# Patient Record
Sex: Male | Born: 1980 | Race: White | Hispanic: No | Marital: Married | Smoking: Never smoker
Health system: Southern US, Community
[De-identification: ages and names within clinical notes are randomized; demographics above are authoritative.]

## PROBLEM LIST (undated history)

## (undated) HISTORY — PX: VASECTOMY: SHX75

---

## 2017-03-28 ENCOUNTER — Encounter: Payer: Self-pay | Admitting: Family Medicine

## 2017-03-28 ENCOUNTER — Ambulatory Visit (INDEPENDENT_AMBULATORY_CARE_PROVIDER_SITE_OTHER): Payer: Managed Care, Other (non HMO) | Admitting: Family Medicine

## 2017-03-28 VITALS — BP 134/82 | HR 67 | Temp 98.0°F | Ht 68.0 in | Wt 204.2 lb

## 2017-03-28 DIAGNOSIS — Z136 Encounter for screening for cardiovascular disorders: Secondary | ICD-10-CM | POA: Diagnosis not present

## 2017-03-28 DIAGNOSIS — E785 Hyperlipidemia, unspecified: Secondary | ICD-10-CM | POA: Insufficient documentation

## 2017-03-28 DIAGNOSIS — Z0000 Encounter for general adult medical examination without abnormal findings: Secondary | ICD-10-CM

## 2017-03-28 DIAGNOSIS — E786 Lipoprotein deficiency: Secondary | ICD-10-CM | POA: Insufficient documentation

## 2017-03-28 NOTE — Progress Notes (Addendum)
Christopher Hoffman is a 36 y.o. male is here to [facility].   History of Present Illness:   Water quality scientist, CMA, acting as scribe for Dr. Juleen China.  CC:  Patient comes in today to establish care.  No concerns or complaints today.  States he had elevated triglycerides several months ago but he was not fasting when that lab was drawn.  He is not fasting today.  No known drug allergies.  He is on no medication.  HPI   Christopher Hoffman is a 36 y.o. male who presents for evaluation of dyslipidemia. The patient does not use medications that may worsen dyslipidemias (corticosteroids, progestins, anabolic steroids, diuretics, beta-blockers, amiodarone, cyclosporine, olanzapine). Exercise: three times a week. Previous history of cardiac disease includes: None. Cardiovascular ROS: no chest pain or dyspnea on exertion.  Health Maintenance Due  Topic Date Due  . HIV Screening  07/09/1997    PMHx, SurgHx, SocialHx, Medications, and Allergies were reviewed in the Visit Navigator and updated as appropriate.  History reviewed. No pertinent past medical history.  Past Surgical History:  Procedure Laterality Date  . VASECTOMY      Family History  Problem Relation Age of Onset  . Cancer Maternal Grandmother   . Hypertension Maternal Grandfather    Social History  Substance Use Topics  . Smoking status: Never Smoker  . Smokeless tobacco: Never Used  . Alcohol use Yes     Comment: occasional   Current Medications and Allergies:  No current outpatient prescriptions on file. No Known Allergies Review of Systems:   Review of Systems  Constitutional: Negative for chills and fever.  HENT: Negative for congestion, ear pain and sore throat.   Eyes: Negative for blurred vision.  Respiratory: Negative for cough and shortness of breath.   Cardiovascular: Negative for chest pain and palpitations.  Gastrointestinal: Negative for abdominal pain, nausea and vomiting.  Genitourinary: Negative for  frequency.  Musculoskeletal: Negative for back pain and neck pain.  Skin: Negative for rash.  Neurological: Negative for dizziness, loss of consciousness and headaches.  Psychiatric/Behavioral: Negative for depression. The patient is not nervous/anxious.     Vitals:   Vitals:   03/28/17 1000  BP: 134/82  Pulse: 67  Temp: 98 F (36.7 C)  TempSrc: Oral  SpO2: 97%  Weight: 204 lb 3.2 oz (92.6 kg)  Height: 5\' 8"  (1.727 m)     Body mass index is 31.05 kg/m.  Physical Exam:   Physical Exam  Constitutional: He is oriented to person, place, and time. He appears well-developed and well-nourished. No distress.  HENT:  Head: Normocephalic and atraumatic.  Right Ear: External ear normal.  Left Ear: External ear normal.  Nose: Nose normal.  Mouth/Throat: Oropharynx is clear and moist.  Eyes: Conjunctivae and EOM are normal. Pupils are equal, round, and reactive to light.  Neck: Normal range of motion. Neck supple.  Cardiovascular: Normal rate, regular rhythm, normal heart sounds and intact distal pulses.   Pulmonary/Chest: Effort normal and breath sounds normal.  Abdominal: Soft. Bowel sounds are normal.  Musculoskeletal: Normal range of motion.  Neurological: He is alert and oriented to person, place, and time.  Skin: Skin is warm and dry.  Psychiatric: He has a normal mood and affect. His behavior is normal. Judgment and thought content normal.  Nursing note and vitals reviewed.    Right Anterior Shoulder     Assessment and Plan:    Christopher Hoffman was seen today for establish care.  Diagnoses and all orders  for this visit:  Routine health maintenance -     Comprehensive metabolic panel; Future  Encounter for screening for cardiovascular disorders -     Lipid panel; Future   . Reviewed expectations re: course of current medical issues. . Discussed self-management of symptoms. . Outlined signs and symptoms indicating need for more acute intervention. . Patient verbalized  understanding and all questions were answered. . See orders for this visit as documented in the electronic medical record. . Patient received an After Visit Summary.  CMA served as Education administrator during this visit. History, Physical, and Plan performed by medical provider. Documentation and orders reviewed and attested to. Briscoe Deutscher, D.O.  Briscoe Deutscher, [place], [place] 03/28/2017   Follow-up: No Follow-up on file.  No orders of the defined types were placed in this encounter.  There are no discontinued medications. Orders Placed This Encounter  Procedures  . Comprehensive metabolic panel  . Lipid panel

## 2017-04-04 ENCOUNTER — Other Ambulatory Visit (INDEPENDENT_AMBULATORY_CARE_PROVIDER_SITE_OTHER): Payer: Managed Care, Other (non HMO)

## 2017-04-04 DIAGNOSIS — Z0000 Encounter for general adult medical examination without abnormal findings: Secondary | ICD-10-CM | POA: Diagnosis not present

## 2017-04-04 DIAGNOSIS — Z136 Encounter for screening for cardiovascular disorders: Secondary | ICD-10-CM

## 2017-04-04 LAB — COMPREHENSIVE METABOLIC PANEL
ALT: 27 U/L (ref 0–53)
AST: 30 U/L (ref 0–37)
Albumin: 4.6 g/dL (ref 3.5–5.2)
Alkaline Phosphatase: 66 U/L (ref 39–117)
BUN: 18 mg/dL (ref 6–23)
CO2: 29 mEq/L (ref 19–32)
Calcium: 9.3 mg/dL (ref 8.4–10.5)
Chloride: 105 mEq/L (ref 96–112)
Creatinine, Ser: 0.97 mg/dL (ref 0.40–1.50)
GFR: 93.76 mL/min (ref >60.00)
Glucose, Bld: 103 mg/dL — ABNORMAL HIGH (ref 70–99)
Potassium: 4.2 mEq/L (ref 3.5–5.1)
Sodium: 139 mEq/L (ref 135–145)
Total Bilirubin: 0.6 mg/dL (ref 0.2–1.2)
Total Protein: 7.1 g/dL (ref 6.0–8.3)

## 2017-04-04 LAB — LIPID PANEL
Cholesterol: 156 mg/dL (ref 0–200)
HDL: 37.9 mg/dL — ABNORMAL LOW (ref >39.00)
LDL Cholesterol: 98 mg/dL (ref 0–99)
NonHDL: 118.51
Total CHOL/HDL Ratio: 4
Triglycerides: 104 mg/dL (ref 0.0–149.0)
VLDL: 20.8 mg/dL (ref 0.0–40.0)

## 2017-04-09 ENCOUNTER — Telehealth: Payer: Self-pay | Admitting: Family Medicine

## 2017-04-30 ENCOUNTER — Telehealth: Payer: Self-pay | Admitting: Family Medicine

## 2017-04-30 NOTE — Telephone Encounter (Signed)
Received  9 pages from [facility] forwarded to Dr. Briscoe Deutscher.

## 2017-07-19 ENCOUNTER — Encounter (HOSPITAL_COMMUNITY): Payer: Self-pay | Admitting: *Deleted

## 2017-07-19 ENCOUNTER — Ambulatory Visit (HOSPITAL_COMMUNITY)
Admission: EM | Admit: 2017-07-19 | Discharge: 2017-07-19 | Disposition: A | Discharge status: Home / Self Care | Payer: Managed Care, Other (non HMO) | Attending: Emergency Medicine | Admitting: Emergency Medicine

## 2017-07-19 ENCOUNTER — Emergency Department (HOSPITAL_COMMUNITY): Payer: Managed Care, Other (non HMO)

## 2017-07-19 ENCOUNTER — Emergency Department (HOSPITAL_COMMUNITY): Payer: Managed Care, Other (non HMO) | Admitting: Anesthesiology

## 2017-07-19 ENCOUNTER — Ambulatory Visit: Payer: Self-pay | Admitting: Surgery

## 2017-07-19 ENCOUNTER — Encounter (HOSPITAL_COMMUNITY)
Admission: EM | Disposition: A | Discharge status: Home / Self Care | Payer: Self-pay | Source: Home / Self Care | Attending: Emergency Medicine

## 2017-07-19 DIAGNOSIS — K439 Ventral hernia without obstruction or gangrene: Secondary | ICD-10-CM | POA: Diagnosis not present

## 2017-07-19 DIAGNOSIS — K353 Acute appendicitis with localized peritonitis, without perforation or gangrene: Secondary | ICD-10-CM

## 2017-07-19 DIAGNOSIS — D1803 Hemangioma of intra-abdominal structures: Secondary | ICD-10-CM | POA: Insufficient documentation

## 2017-07-19 DIAGNOSIS — R161 Splenomegaly, not elsewhere classified: Secondary | ICD-10-CM | POA: Insufficient documentation

## 2017-07-19 DIAGNOSIS — R109 Unspecified abdominal pain: Secondary | ICD-10-CM | POA: Diagnosis present

## 2017-07-19 HISTORY — PX: LAPAROSCOPIC APPENDECTOMY: SHX408

## 2017-07-19 LAB — CBC
HEMATOCRIT: 46.2 % (ref 39.0–52.0)
Hemoglobin: 16.4 g/dL (ref 13.0–17.0)
MCH: 30 pg (ref 26.0–34.0)
MCHC: 35.5 g/dL (ref 30.0–36.0)
MCV: 84.5 fL (ref 78.0–100.0)
Platelets: 213 10*3/uL (ref 150–400)
RBC: 5.47 MIL/uL (ref 4.22–5.81)
RDW: 12.5 % (ref 11.5–15.5)
WBC: 15.5 10*3/uL — AB (ref 4.0–10.5)

## 2017-07-19 LAB — URINALYSIS, ROUTINE W REFLEX MICROSCOPIC
Bilirubin Urine: NEGATIVE
GLUCOSE, UA: NEGATIVE mg/dL
Hgb urine dipstick: NEGATIVE
KETONES UR: NEGATIVE mg/dL
LEUKOCYTES UA: NEGATIVE
Nitrite: NEGATIVE
PH: 7 (ref 5.0–8.0)
Protein, ur: NEGATIVE mg/dL
Specific Gravity, Urine: 1.018 (ref 1.005–1.030)

## 2017-07-19 LAB — COMPREHENSIVE METABOLIC PANEL
ALBUMIN: 4.7 g/dL (ref 3.5–5.0)
ALT: 28 U/L (ref 17–63)
AST: 22 U/L (ref 15–41)
Alkaline Phosphatase: 69 U/L (ref 38–126)
Anion gap: 10 (ref 5–15)
BUN: 15 mg/dL (ref 6–20)
CHLORIDE: 101 mmol/L (ref 101–111)
CO2: 26 mmol/L (ref 22–32)
Calcium: 9.5 mg/dL (ref 8.9–10.3)
Creatinine, Ser: 0.98 mg/dL (ref 0.61–1.24)
GFR calc Af Amer: >60 mL/min (ref >60)
Glucose, Bld: 109 mg/dL — ABNORMAL HIGH (ref 65–99)
POTASSIUM: 4 mmol/L (ref 3.5–5.1)
SODIUM: 137 mmol/L (ref 135–145)
Total Bilirubin: 1 mg/dL (ref 0.3–1.2)
Total Protein: 7.5 g/dL (ref 6.5–8.1)

## 2017-07-19 LAB — LIPASE, BLOOD: LIPASE: 31 U/L (ref 11–51)

## 2017-07-19 SURGERY — APPENDECTOMY, LAPAROSCOPIC
Anesthesia: General | Site: Abdomen

## 2017-07-19 MED ORDER — METRONIDAZOLE IN NACL 5-0.79 MG/ML-% IV SOLN
500.0000 mg | INTRAVENOUS | Status: DC
  Filled 2017-07-19 (×2): qty 100

## 2017-07-19 MED ORDER — FENTANYL CITRATE (PF) 250 MCG/5ML IJ SOLN
INTRAMUSCULAR | Status: AC
  Filled 2017-07-19: qty 5

## 2017-07-19 MED ORDER — DEXTROSE 5 % IV SOLN
2.0000 g | Freq: Once | INTRAVENOUS | Status: DC
  Administered 2017-07-19: 2 g via INTRAVENOUS

## 2017-07-19 MED ORDER — ONDANSETRON HCL 4 MG/2ML IJ SOLN
INTRAMUSCULAR | Status: DC | PRN
  Administered 2017-07-19: 4 mg via INTRAVENOUS

## 2017-07-19 MED ORDER — DOCUSATE SODIUM 100 MG PO CAPS: 100.0000 mg | ORAL_CAPSULE | Freq: Two times a day (BID) | ORAL | 0 refills | Status: AC

## 2017-07-19 MED ORDER — ONDANSETRON HCL 4 MG/2ML IJ SOLN
INTRAMUSCULAR | Status: AC
  Filled 2017-07-19: qty 2

## 2017-07-19 MED ORDER — HYDROCODONE-ACETAMINOPHEN 5-325 MG PO TABS: 1.0000 | ORAL_TABLET | Freq: Four times a day (QID) | ORAL | 0 refills | Status: DC | PRN

## 2017-07-19 MED ORDER — FENTANYL CITRATE (PF) 100 MCG/2ML IJ SOLN
INTRAMUSCULAR | Status: DC | PRN
  Administered 2017-07-19 (×5): 50 ug via INTRAVENOUS

## 2017-07-19 MED ORDER — SODIUM CHLORIDE 0.9 % IV SOLN
INTRAVENOUS | Status: DC | PRN
  Administered 2017-07-19: 16:00:00 via INTRAVENOUS

## 2017-07-19 MED ORDER — METRONIDAZOLE IN NACL 5-0.79 MG/ML-% IV SOLN
500.0000 mg | Freq: Once | INTRAVENOUS | Status: DC
  Administered 2017-07-19: 500 mg via INTRAVENOUS

## 2017-07-19 MED ORDER — ROCURONIUM BROMIDE 10 MG/ML (PF) SYRINGE
PREFILLED_SYRINGE | INTRAVENOUS | Status: AC
  Filled 2017-07-19: qty 5

## 2017-07-19 MED ORDER — 0.9 % SODIUM CHLORIDE (POUR BTL) OPTIME
TOPICAL | Status: DC | PRN
  Administered 2017-07-19: 1000 mL

## 2017-07-19 MED ORDER — MEPERIDINE HCL 25 MG/ML IJ SOLN: 6.2500 mg | INTRAMUSCULAR | Status: DC | PRN

## 2017-07-19 MED ORDER — MIDAZOLAM HCL 5 MG/5ML IJ SOLN
INTRAMUSCULAR | Status: DC | PRN
  Administered 2017-07-19: 2 mg via INTRAVENOUS

## 2017-07-19 MED ORDER — DEXTROSE 5 % IV SOLN
2.0000 g | INTRAVENOUS | Status: DC
  Filled 2017-07-19 (×2): qty 2

## 2017-07-19 MED ORDER — BUPIVACAINE-EPINEPHRINE 0.25% -1:200000 IJ SOLN
INTRAMUSCULAR | Status: DC | PRN
  Administered 2017-07-19: 10 mL

## 2017-07-19 MED ORDER — MIDAZOLAM HCL 2 MG/2ML IJ SOLN
INTRAMUSCULAR | Status: AC
  Filled 2017-07-19: qty 2

## 2017-07-19 MED ORDER — PROPOFOL 10 MG/ML IV BOLUS
INTRAVENOUS | Status: AC
  Filled 2017-07-19: qty 20

## 2017-07-19 MED ORDER — LIDOCAINE HCL (CARDIAC) 20 MG/ML IV SOLN
INTRAVENOUS | Status: DC | PRN
  Administered 2017-07-19: 50 mg via INTRAVENOUS

## 2017-07-19 MED ORDER — LIDOCAINE 2% (20 MG/ML) 5 ML SYRINGE
INTRAMUSCULAR | Status: AC
  Filled 2017-07-19: qty 5

## 2017-07-19 MED ORDER — SUGAMMADEX SODIUM 200 MG/2ML IV SOLN
INTRAVENOUS | Status: AC
  Filled 2017-07-19: qty 2

## 2017-07-19 MED ORDER — HYDROMORPHONE HCL 1 MG/ML IJ SOLN
INTRAMUSCULAR | Status: AC
  Administered 2017-07-19: 0.5 mg via INTRAVENOUS
  Filled 2017-07-19: qty 1

## 2017-07-19 MED ORDER — BUPIVACAINE-EPINEPHRINE (PF) 0.25% -1:200000 IJ SOLN
INTRAMUSCULAR | Status: AC
  Filled 2017-07-19: qty 30

## 2017-07-19 MED ORDER — LACTATED RINGERS IV SOLN
INTRAVENOUS | Status: DC | PRN
  Administered 2017-07-19: 15:00:00 via INTRAVENOUS

## 2017-07-19 MED ORDER — SODIUM CHLORIDE 0.9 % IR SOLN
Status: DC | PRN
  Administered 2017-07-19: 1000 mL

## 2017-07-19 MED ORDER — SUCCINYLCHOLINE CHLORIDE 200 MG/10ML IV SOSY
PREFILLED_SYRINGE | INTRAVENOUS | Status: AC
  Filled 2017-07-19: qty 10

## 2017-07-19 MED ORDER — PROMETHAZINE HCL 25 MG/ML IJ SOLN: 6.2500 mg | INTRAMUSCULAR | Status: DC | PRN

## 2017-07-19 MED ORDER — ROCURONIUM BROMIDE 100 MG/10ML IV SOLN
INTRAVENOUS | Status: DC | PRN
  Administered 2017-07-19: 10 mg via INTRAVENOUS
  Administered 2017-07-19: 40 mg via INTRAVENOUS

## 2017-07-19 MED ORDER — HYDROMORPHONE HCL 1 MG/ML IJ SOLN
0.2500 mg | INTRAMUSCULAR | Status: DC | PRN
  Administered 2017-07-19 (×2): 0.5 mg via INTRAVENOUS

## 2017-07-19 MED ORDER — LACTATED RINGERS IV SOLN: INTRAVENOUS | Status: DC

## 2017-07-19 MED ORDER — IOPAMIDOL (ISOVUE-300) INJECTION 61%
INTRAVENOUS | Status: AC
  Filled 2017-07-19: qty 100

## 2017-07-19 MED ORDER — SODIUM CHLORIDE 0.9 % IV BOLUS (SEPSIS)
1000.0000 mL | Freq: Once | INTRAVENOUS | Status: AC
  Administered 2017-07-19: 1000 mL via INTRAVENOUS

## 2017-07-19 MED ORDER — DEXAMETHASONE SODIUM PHOSPHATE 10 MG/ML IJ SOLN
INTRAMUSCULAR | Status: DC | PRN
  Administered 2017-07-19: 5 mg via INTRAVENOUS

## 2017-07-19 MED ORDER — ARTIFICIAL TEARS OPHTHALMIC OINT: TOPICAL_OINTMENT | OPHTHALMIC | Status: DC | PRN

## 2017-07-19 MED ORDER — SUGAMMADEX SODIUM 200 MG/2ML IV SOLN
INTRAVENOUS | Status: DC | PRN
  Administered 2017-07-19: 200 mg via INTRAVENOUS

## 2017-07-19 MED ORDER — PROPOFOL 10 MG/ML IV BOLUS
INTRAVENOUS | Status: DC | PRN
  Administered 2017-07-19: 180 mg via INTRAVENOUS

## 2017-07-19 MED ORDER — IOPAMIDOL (ISOVUE-300) INJECTION 61%
INTRAVENOUS | Status: AC
  Administered 2017-07-19: 100 mL
  Filled 2017-07-19: qty 100

## 2017-07-19 MED ORDER — SUCCINYLCHOLINE CHLORIDE 20 MG/ML IJ SOLN
INTRAMUSCULAR | Status: DC | PRN
  Administered 2017-07-19: 140 mg via INTRAVENOUS

## 2017-07-19 MED ORDER — DEXAMETHASONE SODIUM PHOSPHATE 10 MG/ML IJ SOLN
INTRAMUSCULAR | Status: AC
  Filled 2017-07-19: qty 1

## 2017-07-19 SURGICAL SUPPLY — 39 items
BLADE CLIPPER SURG (BLADE) IMPLANT
CANISTER SUCT 3000ML PPV (MISCELLANEOUS) ×2 IMPLANT
CHLORAPREP W/TINT 26ML (MISCELLANEOUS) ×2 IMPLANT
COVER SURGICAL LIGHT HANDLE (MISCELLANEOUS) ×2 IMPLANT
CUTTER FLEX LINEAR 45M (STAPLE) ×2 IMPLANT
DERMABOND ADHESIVE PROPEN (GAUZE/BANDAGES/DRESSINGS) ×1
DERMABOND ADVANCED (GAUZE/BANDAGES/DRESSINGS) ×1
DERMABOND ADVANCED .7 DNX12 (GAUZE/BANDAGES/DRESSINGS) ×1 IMPLANT
DERMABOND ADVANCED .7 DNX6 (GAUZE/BANDAGES/DRESSINGS) ×1 IMPLANT
DEVICE PMI PUNCTURE CLOSURE (MISCELLANEOUS) ×2 IMPLANT
ELECT REM PT RETURN 9FT ADLT (ELECTROSURGICAL) ×2
ELECTRODE REM PT RTRN 9FT ADLT (ELECTROSURGICAL) ×1 IMPLANT
GLOVE BIO SURGEON STRL SZ 6 (GLOVE) ×2 IMPLANT
GLOVE BIO SURGEON STRL SZ7 (GLOVE) ×2 IMPLANT
GLOVE BIOGEL PI IND STRL 6.5 (GLOVE) ×2 IMPLANT
GLOVE BIOGEL PI IND STRL 7.0 (GLOVE) ×1 IMPLANT
GLOVE BIOGEL PI INDICATOR 6.5 (GLOVE) ×2
GLOVE BIOGEL PI INDICATOR 7.0 (GLOVE) ×1
GOWN STRL REUS W/ TWL LRG LVL3 (GOWN DISPOSABLE) ×3 IMPLANT
GOWN STRL REUS W/TWL LRG LVL3 (GOWN DISPOSABLE) ×3
KIT BASIN OR (CUSTOM PROCEDURE TRAY) ×2 IMPLANT
KIT ROOM TURNOVER OR (KITS) ×2 IMPLANT
NDL INSUFFICATION SHT 14GA (NEEDLE) ×2 IMPLANT
NEEDLE INSUFFLATION 14GA 120MM (NEEDLE) ×2 IMPLANT
NS IRRIG 1000ML POUR BTL (IV SOLUTION) ×2 IMPLANT
PAD ARMBOARD 7.5X6 YLW CONV (MISCELLANEOUS) ×4 IMPLANT
POUCH SPECIMEN RETRIEVAL 10MM (ENDOMECHANICALS) ×2 IMPLANT
RELOAD 45 VASCULAR/THIN (ENDOMECHANICALS) ×4 IMPLANT
RELOAD STAPLE TA45 3.5 REG BLU (ENDOMECHANICALS) ×2 IMPLANT
SET IRRIG TUBING LAPAROSCOPIC (IRRIGATION / IRRIGATOR) ×2 IMPLANT
SLEEVE ENDOPATH XCEL 5M (ENDOMECHANICALS) ×2 IMPLANT
SPECIMEN JAR SMALL (MISCELLANEOUS) ×2 IMPLANT
SUT MNCRL AB 4-0 PS2 18 (SUTURE) ×2 IMPLANT
TOWEL OR 17X24 6PK STRL BLUE (TOWEL DISPOSABLE) ×2 IMPLANT
TRAY FOLEY CATH SILVER 16FR (SET/KITS/TRAYS/PACK) ×2 IMPLANT
TRAY LAPAROSCOPIC MC (CUSTOM PROCEDURE TRAY) ×2 IMPLANT
TROCAR XCEL 12X100 BLDLESS (ENDOMECHANICALS) ×2 IMPLANT
TROCAR XCEL NON-BLD 5MMX100MML (ENDOMECHANICALS) ×2 IMPLANT
TUBING INSUFFLATION (TUBING) ×2 IMPLANT

## 2017-07-19 NOTE — ED Notes (Signed)
Hand off given to [facility].

## 2017-07-19 NOTE — Anesthesia Postprocedure Evaluation (Signed)
Anesthesia Post Note  Patient: Christopher Hoffman  Procedure(s) Performed: Procedure(s) (LRB): APPENDECTOMY LAPAROSCOPIC (N/A)     Patient location during evaluation: PACU Anesthesia Type: General Level of consciousness: awake and alert Pain management: pain level controlled Vital Signs Assessment: post-procedure vital signs reviewed and stable Respiratory status: spontaneous breathing, nonlabored ventilation, respiratory function stable and patient connected to nasal cannula oxygen Cardiovascular status: blood pressure returned to baseline and stable Postop Assessment: no signs of nausea or vomiting Anesthetic complications: no    Last Vitals:  Vitals:   07/19/17 1755 07/19/17 1800  BP: 138/83   Pulse: 83 82  Resp: 10 14  Temp:  37.1 C  SpO2: 94% 94%    Last Pain:  Vitals:   07/19/17 1722  TempSrc:   PainSc: 6     LLE Motor Response: Purposeful movement;Responds to commands (07/19/17 1800) LLE Sensation: Full sensation (07/19/17 1800) RLE Motor Response: Purposeful movement;Responds to commands (07/19/17 1800) RLE Sensation: Full sensation (07/19/17 1800)      Effie Berkshire

## 2017-07-19 NOTE — ED Triage Notes (Signed)
Pt reports onset this early am of RLQ. Denies n/v/d or fever. No acute distress is noted at triage.

## 2017-07-19 NOTE — Op Note (Signed)
Operative Report  Christopher Hoffman 36 y.o. male  [id number]  [id number]  07/19/2017  Surgeon: Clovis Riley   Assistant: none  Procedure performed: Laparoscopic Appendectomy  Preop diagnosis: Acute appendicitis  Post-op diagnosis/intraop findings: Acute appendicitis - with localized peritonitis   Specimens: appendix  EBL: minimal  Complications: none  Description of procedure: After obtaining informed consent the patient was brought to the operating room. Antibiotics and subcutaneous heparin were administered. SCD's were applied. General endotracheal anesthesia was initiated and a formal time-out was performed. The abdomen was prepped and draped in the usual sterile fashion and the abdomen was entered using an infraumbilical Veress needle and insufflated to 15 mmHg. A 5 mm trocar and camera were then introduced, the abdomen was inspected and there is no evidence of injury from our entry. A suprapubic 5 mm trocar and a left lower quadrant 12 mm trocar were introduced under direct visualization following infiltration with local. The patient was then placed in Trendelenburg and rotated to the left and the small bowel was reflected cephalad. The appendix was visualized: it was acutely inflamed with a purulent exudate and scant adjacent murky fluid but no perforation. The appendix was grasped and gentle blunt sweeping released some inflammatory adhesions to the abdominal wall and cecum/mesentery. A window was created at the base of the appendix and a blue load linear cutting stapler was used to transect the appendix from the cecum. The tissue here was healthy and uninflamed appearing. A white load linear cutting stapler was then used to transect the appendiceal mesentery. Hemostasis was ensured. The appendix was placed in an Endo Catch bag and removed through our 12 mm trocar site. The right lower quadrant and pelvis were aspirated and irrigated with warm saline, the effluent was  clear. The omentum was directed to lie over the staple lines. The 89mm trocar site in the left lower quadrant was closed with a 0 vicryl in the fascia under direct visualization using a PMI device. The abdomen was desufflated and all trocars removed. The skin incisions were closed with running subcuticular monocryl and Dermabond. The patient was awakened, extubated and transported to the recovery room in stable condition.   All counts were correct at the completion of the case.

## 2017-07-19 NOTE — Transfer of Care (Signed)
Immediate Anesthesia Transfer of Care Note  Patient: Christopher Hoffman [facility]  Procedure(s) Performed: Procedure(s): APPENDECTOMY LAPAROSCOPIC (N/A)  Patient Location: PACU  Anesthesia Type:General  Level of Consciousness: awake and alert   Airway & Oxygen Therapy: Patient Spontanous Breathing  Post-op Assessment: Report given to RN and Post -op Vital signs reviewed and stable  Post vital signs: Reviewed and stable  Last Vitals:  Vitals:   07/19/17 1447 07/19/17 1449  BP:  (!) 148/96  Pulse: 83   Resp:    Temp:    SpO2: 100%     Last Pain:  Vitals:   07/19/17 1012  TempSrc: Oral  PainSc:          Complications: No apparent anesthesia complications

## 2017-07-19 NOTE — Anesthesia Procedure Notes (Signed)
Procedure Name: Intubation Date/Time: 07/19/2017 3:58 PM Performed by: Suzy Bouchard Pre-anesthesia Checklist: Patient identified, Suction available, Emergency Drugs available, Patient being monitored and Timeout performed Patient Re-evaluated:Patient Re-evaluated prior to induction Oxygen Delivery Method: Circle system utilized Preoxygenation: Pre-oxygenation with 100% oxygen Induction Type: IV induction and Rapid sequence Laryngoscope Size: Miller and 2 Grade View: Grade II Tube type: Oral Tube size: 7.5 mm Number of attempts: 1 Airway Equipment and Method: Stylet Placement Confirmation: ETT inserted through vocal cords under direct vision,  positive ETCO2 and breath sounds checked- equal and bilateral Secured at: 21 cm Tube secured with: Tape Dental Injury: Teeth and Oropharynx as per pre-operative assessment

## 2017-07-19 NOTE — Discharge Instructions (Signed)
[facility] ______[facility] °LAPAROSCOPIC SURGERY: POST OP INSTRUCTIONS °Always review your discharge instruction sheet given to you by the facility where your surgery was performed. °IF YOU HAVE DISABILITY OR FAMILY LEAVE FORMS, YOU MUST BRING THEM TO THE OFFICE FOR PROCESSING.   °DO NOT GIVE THEM TO YOUR DOCTOR. ° °1. A prescription for pain medication may be given to you upon discharge.  Take your pain medication as prescribed, if needed.  If narcotic pain medicine is not needed, then you may take acetaminophen (Tylenol) or ibuprofen (Advil) as needed. °2. Take your usually prescribed medications unless otherwise directed. °3. If you need a refill on your pain medication, please contact your pharmacy.  They will contact our office to request authorization. Prescriptions will not be filled after 5pm or on week-ends. °4. You should follow a light diet the first few days after arrival home, such as soup and crackers, etc.  Be sure to include lots of fluids daily. °5. Most patients will experience some swelling and bruising in the area of the incisions.  Ice packs will help.  Swelling and bruising can take several days to resolve.  °6. It is common to experience some constipation if taking pain medication after surgery.  Increasing fluid intake and taking a stool softener (such as Colace) will usually help or prevent this problem from occurring.  A mild laxative (Milk of Magnesia or Miralax) should be taken according to package instructions if there are no bowel movements after 48 hours. °7. Unless discharge instructions indicate otherwise, you may remove your bandages 24-48 hours after surgery, and you may shower at that time.  You may have steri-strips (small skin tapes) in place directly over the incision.  These strips should be left on the skin for 7-10 days.  If your surgeon used skin glue on the incision, you may shower in 24 hours.  The glue will flake off over the next 2-3 weeks.  Any sutures or  staples will be removed at the office during your follow-up visit. °8. ACTIVITIES:  You may resume regular (light) daily activities beginning the next day--such as daily self-care, walking, climbing stairs--gradually increasing activities as tolerated.  You may have sexual intercourse when it is comfortable.  Refrain from any heavy lifting or straining until approved by your doctor. °a. You may drive when you are no longer taking prescription pain medication, you can comfortably wear a seatbelt, and you can safely maneuver your car and apply brakes. °b. RETURN TO WORK:  __1 week________________________________________________________ °9. You should see your doctor in the office for a follow-up appointment approximately 2-3 weeks after your surgery.  Make sure that you call for this appointment within a day or two after you arrive home to insure a convenient appointment time. °10. OTHER INSTRUCTIONS: __________________________________________________________________________________________________________________________ __________________________________________________________________________________________________________________________ °WHEN TO CALL YOUR DOCTOR: °1. Fever over 101.0 °2. Inability to urinate °3. Continued bleeding from incision. °4. Increased pain, redness, or drainage from the incision. °5. Increasing abdominal pain ° °The clinic staff is available to answer your questions during regular business hours.  Please don’t hesitate to call and ask to speak to one of the nurses for clinical concerns.  If you have a medical emergency, go to the nearest emergency room or call 911.  A surgeon from [facility] is always on call at the hospital. °[address], [address], [place], [place]  [address] ? [address] [address], [place], [place]   [address] °[phone] ? [phone] ? FAX [phone] °Web   site: [email or web address] ° °

## 2017-07-19 NOTE — ED Provider Notes (Signed)
[facility] DEPT Provider Note   CSN: [id number] Arrival date & time: 07/19/17  4854     History   Chief Complaint Chief Complaint  Patient presents with  . Abdominal Pain    HPI Christopher Hoffman is a 36 y.o. male.  HPI  36 year old male presents with acute right lower quadrant abdominal pain that started around 3 AM. He states that the pain is a dull ache at all times but any type of minimal movement or adjusting causes more severe sharp pain. There is no fever, back pain, nausea, vomiting, or urinary symptoms. He states he had a couple bowel movements but this did not help. He became concerned for possible appendicitis and he came into the ER. Has not taken anything for the pain. Denies any testicular pain. No hematuria.  History reviewed. No pertinent past medical history.  Patient Active Problem List   Diagnosis Date Noted  . Hyperlipidemia 03/28/2017    Past Surgical History:  Procedure Laterality Date  . VASECTOMY         Home Medications    Prior to Admission medications   Medication Sig Start Date End Date Taking? Authorizing Provider  docusate sodium (COLACE) 100 MG capsule Take 1 capsule (100 mg total) by mouth 2 (two) times daily. 07/19/17 08/18/17  Clovis Riley, MD  HYDROcodone-acetaminophen (NORCO/VICODIN) 5-325 MG tablet Take 1 tablet by mouth every 6 (six) hours as needed for moderate pain. 07/19/17   Clovis Riley, MD    Family History Family History  Problem Relation Age of Onset  . Hyperlipidemia Father   . Cancer Maternal Grandmother   . Hypertension Maternal Grandfather     Social History Social History  Substance Use Topics  . Smoking status: Never Smoker  . Smokeless tobacco: Never Used  . Alcohol use Yes     Comment: occasional     Allergies   Patient has no known allergies.   Review of Systems Review of Systems  Gastrointestinal: Positive for abdominal pain. Negative for nausea and vomiting.  Genitourinary:  Negative for dysuria, hematuria and testicular pain.  Musculoskeletal: Negative for back pain.  All other systems reviewed and are negative.    Physical Exam Updated Vital Signs BP 138/83   Pulse 82   Temp 98.8 F (37.1 C)   Resp 14   Ht 5\' 8"  (1.727 m)   Wt 92.1 kg (203 lb)   SpO2 94%   BMI 30.87 kg/m   Physical Exam  Constitutional: He is oriented to person, place, and time. He appears well-developed and well-nourished. No distress.  HENT:  Head: Normocephalic and atraumatic.  Right Ear: External ear normal.  Left Ear: External ear normal.  Nose: Nose normal.  Eyes: Right eye exhibits no discharge. Left eye exhibits no discharge.  Neck: Neck supple.  Cardiovascular: Normal rate, regular rhythm and normal heart sounds.   Pulmonary/Chest: Effort normal and breath sounds normal.  Abdominal: Soft. There is tenderness in the right lower quadrant.  Genitourinary: Testes normal and penis normal. Right testis shows no swelling and no tenderness. Left testis shows no swelling and no tenderness. Circumcised.  Musculoskeletal: He exhibits no edema.  Neurological: He is alert and oriented to person, place, and time.  Skin: Skin is warm and dry. He is not diaphoretic.  Nursing note and vitals reviewed.    ED Treatments / Results  Labs (all labs ordered are listed, but only abnormal results are displayed) Labs Reviewed  COMPREHENSIVE METABOLIC PANEL - Abnormal; Notable for  the following:       Result Value   Glucose, Bld 109 (*)    All other components within normal limits  CBC - Abnormal; Notable for the following:    WBC 15.5 (*)    All other components within normal limits  LIPASE, BLOOD  URINALYSIS, ROUTINE W REFLEX MICROSCOPIC  SURGICAL PATHOLOGY    EKG  EKG Interpretation None       Radiology Ct Abdomen Pelvis W Contrast  Result Date: 07/19/2017 CLINICAL DATA:  Right lower quadrant pain EXAM: CT ABDOMEN AND PELVIS WITH CONTRAST TECHNIQUE: Multidetector CT  imaging of the abdomen and pelvis was performed using the standard protocol following bolus administration of intravenous contrast. CONTRAST:  140mL ISOVUE-300 IOPAMIDOL (ISOVUE-300) INJECTION 61% COMPARISON:  None. FINDINGS: Lower chest: Lung bases are clear. Hepatobiliary: There is an apparent hemangioma in the posterior segment of the right lobe of the liver measuring 2.7 x 1.2 cm. No other focal liver lesions are evident. Gallbladder wall is not appreciably thickened. There is no biliary duct dilatation. Pancreas: There is no pancreatic mass or inflammatory focus. Spleen: Spleen measures 14.1 x 12.9 x 6.2 cm with a measured splenic volume of 564 cubic cm. There are small cysts along the periphery of the spleen laterally. Spleen otherwise appears unremarkable. Adrenals/Urinary Tract: Adrenals appear normal bilaterally. Kidneys bilaterally show no evident mass or hydronephrosis on either side. There is no renal or ureteral calculus on either side. Urinary bladder is midline with wall thickness within normal limits. Stomach/Bowel: There is no appreciable bowel wall or mesenteric thickening. No evident bowel obstruction. No free air or portal venous air. Vascular/Lymphatic: No abdominal aortic aneurysm. No vascular lesion evident. There is no appreciable adenopathy in the abdomen or pelvis. Reproductive: Prostate and seminal vesicles appear normal in size and contour. No pelvic mass evident. Other: The appendix is enlarged, measuring 11 mm in diameter. There is periappendiceal soft tissue thickening and stranding. These are findings indicative of acute appendiceal inflammation. No abscess or perforation is evident in the appendiceal region. No abscess or ascites noted elsewhere in the abdomen or pelvis. There is a rather minimal ventral hernia containing only fat. Musculoskeletal: There are no blastic or lytic bone lesions. There is no intramuscular or abdominal wall lesion. IMPRESSION: 1. Findings indicative of  acute appendiceal inflammation. No abscess or perforation evident in the appendiceal region. 2.  Splenomegaly.  Small cysts in the periphery of the spleen. 3. 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 4.  Small ventral hernia containing only fat. 5.  No abscess.  No bowel obstruction. 6.  No renal or ureteral calculus.  No hydronephrosis. Critical Value/emergent results were called by telephone at the time of interpretation on 07/19/2017 at 1:54 pm to Dr. Sherwood Gambler , who verbally acknowledged these results. Electronically Signed   By: Lowella Grip III M.D.   On: 07/19/2017 13:58    Procedures Procedures (including critical care time)  Medications Ordered in ED Medications  cefTRIAXone (ROCEPHIN) 2 g in dextrose 5 % 50 mL IVPB ( Intravenous Canceled Entry 07/19/17 1552)    And  metroNIDAZOLE (FLAGYL) IVPB 500 mg (not administered)  lactated ringers infusion (not administered)  HYDROmorphone (DILAUDID) injection 0.25-0.5 mg (0.5 mg Intravenous Given 07/19/17 1723)  meperidine (DEMEROL) injection 6.25-12.5 mg (not administered)  promethazine (PHENERGAN) injection 6.25-12.5 mg (not administered)  sodium chloride 0.9 % bolus 1,000 mL (1,000 mLs Intravenous New Bag/Given 07/19/17 1240)  iopamidol (ISOVUE-300) 61 % injection (100  mLs  Contrast Given 07/19/17 1322)     Initial Impression / Assessment and Plan / ED Course  I have reviewed the triage vital signs and the nursing notes.  Pertinent labs & imaging results that were available during my care of the patient were reviewed by me and considered in my medical decision making (see chart for details).      CT confirms appendicitis, no complications seen. Kept npo. IV rocephin/flagyl, surgery consulted and will take to OR.  Final Clinical Impressions(s) / ED Diagnoses   Final diagnoses:  Acute appendicitis with localized peritonitis    New Prescriptions Discharge Medication List as of 07/19/2017   5:29 PM    START taking these medications   Details  docusate sodium (COLACE) 100 MG capsule Take 1 capsule (100 mg total) by mouth 2 (two) times daily., Starting Sat 07/19/2017, Until Mon 08/18/2017, Print    HYDROcodone-acetaminophen (NORCO/VICODIN) 5-325 MG tablet Take 1 tablet by mouth every 6 (six) hours as needed for moderate pain., Starting Sat 07/19/2017, Print         Sherwood Gambler, MD 07/19/17 [phone]

## 2017-07-19 NOTE — Anesthesia Preprocedure Evaluation (Addendum)
Anesthesia Evaluation  Patient identified by MRN, date of birth, ID band Patient awake    Reviewed: Allergy & Precautions, NPO status , Patient's Chart, lab work & pertinent test results  Airway Mallampati: I  TM Distance: >3 FB Neck ROM: Full    Dental  (+) Teeth Intact, Dental Advisory Given   Pulmonary neg pulmonary ROS,    breath sounds clear to auscultation       Cardiovascular negative cardio ROS   Rhythm:Regular Rate:Normal     Neuro/Psych negative neurological ROS     GI/Hepatic negative GI ROS, Neg liver ROS,   Endo/Other  negative endocrine ROS  Renal/GU negative Renal ROS     Musculoskeletal negative musculoskeletal ROS (+)   Abdominal   Peds  Hematology negative hematology ROS (+)   Anesthesia Other Findings Day of surgery medications reviewed with the patient.  Reproductive/Obstetrics                            Anesthesia Physical Anesthesia Plan  ASA: I and emergent  Anesthesia Plan: General   Post-op Pain Management:    Induction: Intravenous  PONV Risk Score and Plan: 3 and Ondansetron, Dexamethasone, Midazolam and Treatment may vary due to age or medical condition  Airway Management Planned: Oral ETT  Additional Equipment:   Intra-op Plan:   Post-operative Plan: Extubation in OR  Informed Consent: I have reviewed the patients History and Physical, chart, labs and discussed the procedure including the risks, benefits and alternatives for the proposed anesthesia with the patient or authorized representative who has indicated his/her understanding and acceptance.   Dental advisory given  Plan Discussed with: CRNA  Anesthesia Plan Comments:         Anesthesia Quick Evaluation

## 2017-07-19 NOTE — H&P (Signed)
Surgical H&P  CC: abdominal pain  HPI: Otherwise healthy man who awoke with RLQ pain around 4am today. Unrelieved by bowel movements. No nausea or fever, denies diarrhea. No prior abdominal surgery. Underwent Ct in  ER which shows appendicitis, surgery is asked to see.  No Known Allergies  History reviewed. No pertinent past medical history.  Past Surgical History:  Procedure Laterality Date  . VASECTOMY      Family History  Problem Relation Age of Onset  . Hyperlipidemia Father   . Cancer Maternal Grandmother   . Hypertension Maternal Grandfather     Social History   Social History  . Marital status: Married    Spouse name: N/A  . Number of children: N/A  . Years of education: N/A   Social History Main Topics  . Smoking status: Never Smoker  . Smokeless tobacco: Never Used  . Alcohol use Yes     Comment: occasional  . Drug use: No  . Sexual activity: Yes    Partners: Female   Other Topics Concern  . None   Social History Narrative  . None    No current facility-administered medications on file prior to encounter.    No current outpatient prescriptions on file prior to encounter.    Review of Systems: a complete, 10pt review of systems was completed with pertinent positives and negatives as documented in the HPI.   Physical Exam: Vitals:   07/19/17 1315 07/19/17 1415  BP:    Pulse: 82 77  Resp:    Temp:    SpO2: 99% 99%   Gen: A&Ox3, no distress  Head: normocephalic, atraumatic, EOMI, anicteric.  Neck: supple without mass or thyromegaly Chest: unlabored respirations   Cardiovascular: RRR with palpable distal pulses Abdomen: soft, focally tender in RLQ without guarding  Extremities: warm, without edema, no deformities  Neuro: grossly intact Psych: appropriate mood and affect  Skin: warm and dry  CBC Latest Ref Rng & Units 07/19/2017  WBC 4.0 - 10.5 K/uL 15.5(H)  Hemoglobin 13.0 - 17.0 g/dL 16.4  Hematocrit 39.0 - 52.0 % 46.2  Platelets 150 -  400 K/uL 213    CMP Latest Ref Rng & Units 07/19/2017 04/04/2017  Glucose 65 - 99 mg/dL 109(H) 103(H)  BUN 6 - 20 mg/dL 15 18  Creatinine 0.61 - 1.24 mg/dL 0.98 0.97  Sodium 135 - 145 mmol/L 137 139  Potassium 3.5 - 5.1 mmol/L 4.0 4.2  Chloride 101 - 111 mmol/L 101 105  CO2 22 - 32 mmol/L 26 29  Calcium 8.9 - 10.3 mg/dL 9.5 9.3  Total Protein 6.5 - 8.1 g/dL 7.5 7.1  Total Bilirubin 0.3 - 1.2 mg/dL 1.0 0.6  Alkaline Phos 38 - 126 U/L 69 66  AST 15 - 41 U/L 22 30  ALT 17 - 63 U/L 28 27    No results found for: INR, PROTIME  Imaging: EXAM: CT ABDOMEN AND PELVIS WITH CONTRAST  TECHNIQUE: Multidetector CT imaging of the abdomen and pelvis was performed using the standard protocol following bolus administration of intravenous contrast.  CONTRAST:  125mL ISOVUE-300 IOPAMIDOL (ISOVUE-300) INJECTION 61%  COMPARISON:  None.  FINDINGS: Lower chest: Lung bases are clear.  Hepatobiliary: There is an apparent hemangioma in the posterior segment of the right lobe of the liver measuring 2.7 x 1.2 cm. No other focal liver lesions are evident. Gallbladder wall is not appreciably thickened. There is no biliary duct dilatation.  Pancreas: There is no pancreatic mass or inflammatory focus.  Spleen: Spleen  measures 14.1 x 12.9 x 6.2 cm with a measured splenic volume of 564 cubic cm. There are small cysts along the periphery of the spleen laterally. Spleen otherwise appears unremarkable.  Adrenals/Urinary Tract: Adrenals appear normal bilaterally. Kidneys bilaterally show no evident mass or hydronephrosis on either side. There is no renal or ureteral calculus on either side. Urinary bladder is midline with wall thickness within normal limits.  Stomach/Bowel: There is no appreciable bowel wall or mesenteric thickening. No evident bowel obstruction. No free air or portal venous air.  Vascular/Lymphatic: No abdominal aortic aneurysm. No vascular lesion evident. There is no  appreciable adenopathy in the abdomen or pelvis.  Reproductive: Prostate and seminal vesicles appear normal in size and contour. No pelvic mass evident.  Other: The appendix is enlarged, measuring 11 mm in diameter. There is periappendiceal soft tissue thickening and stranding. These are findings indicative of acute appendiceal inflammation. No abscess or perforation is evident in the appendiceal region.  No abscess or ascites noted elsewhere in the abdomen or pelvis. There is a rather minimal ventral hernia containing only fat.  Musculoskeletal: There are no blastic or lytic bone lesions. There is no intramuscular or abdominal wall lesion.  IMPRESSION: 1. Findings indicative of acute appendiceal inflammation. No abscess or perforation evident in the appendiceal region.  2.  Splenomegaly.  Small cysts in the periphery of the spleen.  3. 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  4.  Small ventral hernia containing only fat.  5.  No abscess.  No bowel obstruction.  6.  No renal or ureteral calculus.  No hydronephrosis.  Critical Value/emergent results were called by telephone at the time of interpretation on 07/19/2017 at 1:54 pm to Dr. Sherwood Gambler , who verbally acknowledged these results.   Electronically Signed   By: Lowella Grip III M.D.   On: 07/19/2017 13:58  A/P: Acute appendicitis. I discussed laparoscopic appendectomy including the surgery, risks of bleeding, infection, pain, scarring, intraabdominal injury, staple line leak, abscess, and need for other procedures. If uncomplicated I advised that he may be able to go home this evening. His questions were answered. Will proceed with laparoscopic appendectomy today.    Romana Juniper, MD [facility], [place] Pager [phone]

## 2017-07-20 ENCOUNTER — Encounter (HOSPITAL_COMMUNITY): Payer: Self-pay | Admitting: Surgery

## 2017-07-25 NOTE — Telephone Encounter (Signed)
This encounter was created in error - please disregard.

## 2018-01-13 ENCOUNTER — Telehealth: Payer: Self-pay | Admitting: Family Medicine

## 2018-01-13 NOTE — Telephone Encounter (Signed)
Copied from [place] [id number] [id number]. Topic: Appointment Scheduling - Scheduling Inquiry for Clinic >> Jan 13, 2018  2:01 PM Ether Griffins B wrote: Reason for CRM: pt needing tb quantiseron for work. Employer will be paying for this lab needing this done as soon as possible.

## 2018-01-14 ENCOUNTER — Other Ambulatory Visit: Payer: Self-pay

## 2018-01-14 ENCOUNTER — Other Ambulatory Visit (INDEPENDENT_AMBULATORY_CARE_PROVIDER_SITE_OTHER): Payer: Self-pay

## 2018-01-14 DIAGNOSIS — Z111 Encounter for screening for respiratory tuberculosis: Secondary | ICD-10-CM

## 2018-01-14 NOTE — Telephone Encounter (Signed)
Ok to order 

## 2018-01-14 NOTE — Telephone Encounter (Signed)
Okay to order?

## 2018-01-14 NOTE — Telephone Encounter (Signed)
Called patient order put in app made.

## 2018-01-15 ENCOUNTER — Other Ambulatory Visit: Payer: Managed Care, Other (non HMO)

## 2018-01-15 DIAGNOSIS — Z111 Encounter for screening for respiratory tuberculosis: Secondary | ICD-10-CM

## 2018-01-15 NOTE — Addendum Note (Signed)
Addended by: Frutoso Chase A on: 01/15/2018 01:56 PM   Modules accepted: Orders

## 2018-01-21 LAB — QUANTIFERON-TB GOLD PLUS
Mitogen-NIL: 9.51 IU/mL
Mitogen-NIL: 9.79 IU/mL
NIL: 0.01 IU/mL
NIL: 0.02 IU/mL
QuantiFERON-TB Gold Plus: NEGATIVE
QuantiFERON-TB Gold Plus: NEGATIVE
TB1-NIL: 0.03 IU/mL
TB1-NIL: 0.03 IU/mL
TB2-NIL: 0.01 IU/mL
TB2-NIL: 0.02 IU/mL

## 2018-01-21 LAB — TIQ-MISC

## 2018-01-21 LAB — TIQ-AIQ

## 2018-01-29 IMAGING — CT CT ABD-PELV W/ CM
2 of 4 series · 15 of 46 positions shown, 17 images · IV contrast (Omni 300)
Comparison: None.

CLINICAL DATA: Right lower quadrant pain

EXAM:
CT ABDOMEN AND PELVIS WITH CONTRAST
TECHNIQUE: Multidetector CT imaging of the abdomen and pelvis was performed
using the standard protocol following bolus administration of
intravenous contrast.
CONTRAST:  100mL 3U73KH-RWW IOPAMIDOL (3U73KH-RWW) INJECTION 61%

[Series 3: a/p w/ 5mm · axial · 0.71mm/px · z∈[+578,+1052]mm · 12 of 105 slices shown, 14 images]
[im 5/105  soft-tissue]
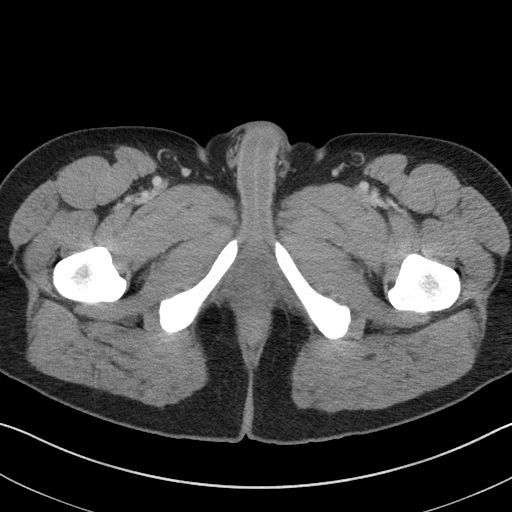
[im 5/105  bone]
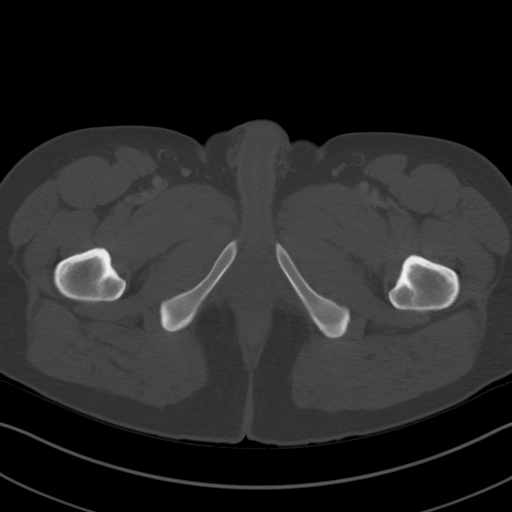
[im 14/105  soft-tissue]
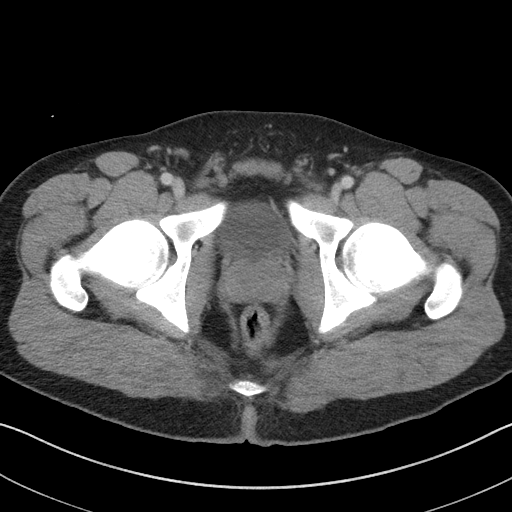
[im 22/105  soft-tissue]
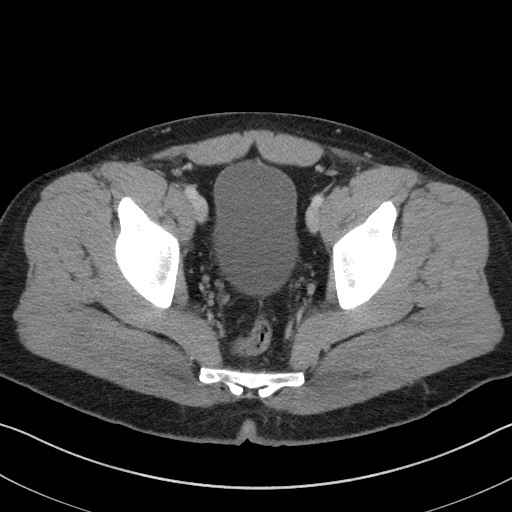
[im 31/105  soft-tissue]
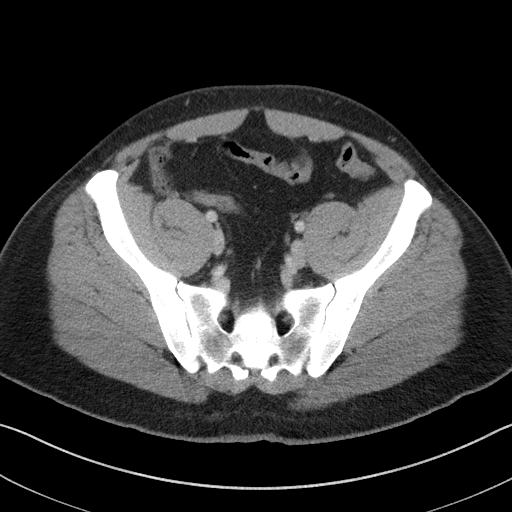
[im 40/105  soft-tissue]
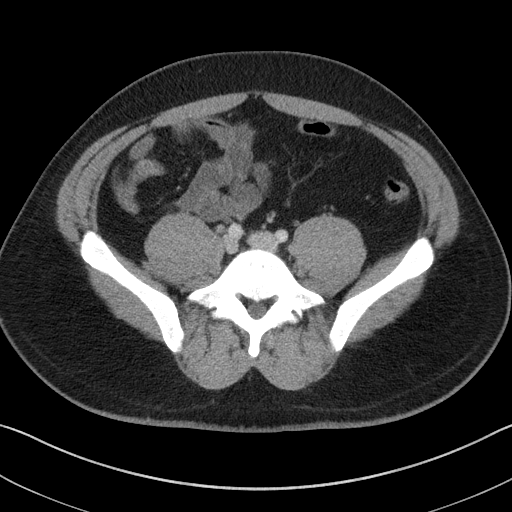
[im 48/105  soft-tissue]
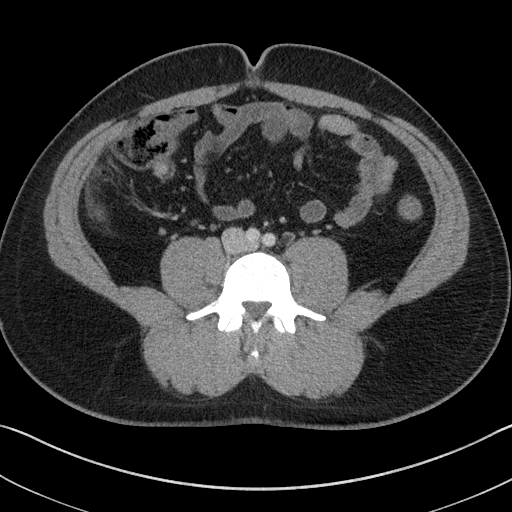
[im 57/105  soft-tissue]
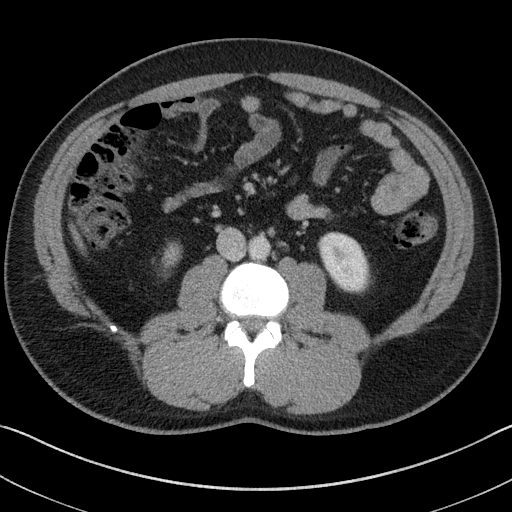
[im 66/105  soft-tissue]
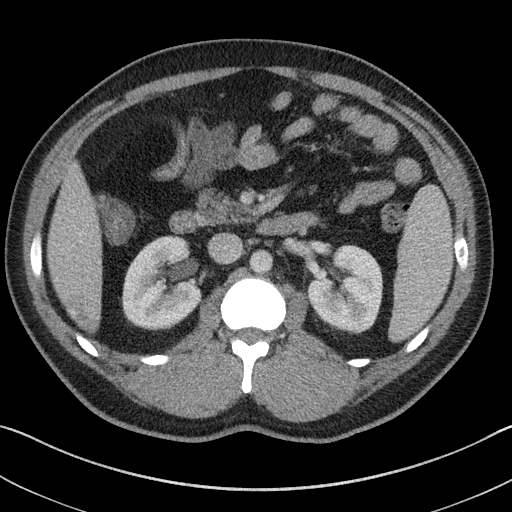
[im 74/105  soft-tissue]
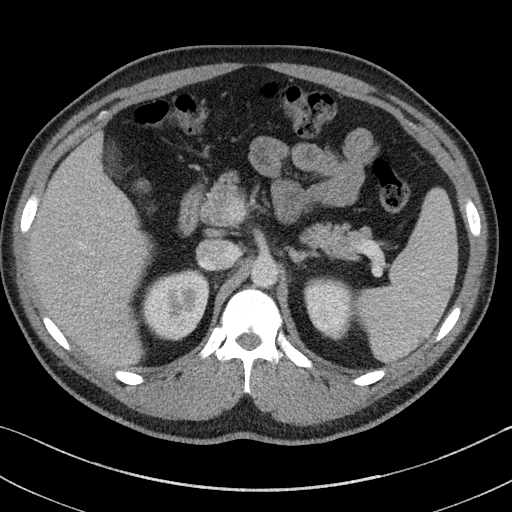
[im 74/105  bone]
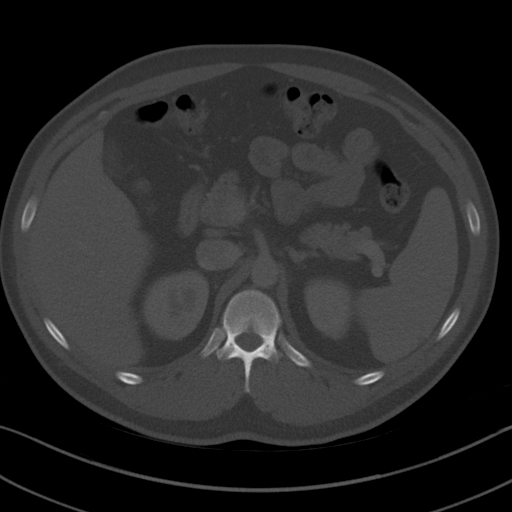
[im 83/105  soft-tissue]
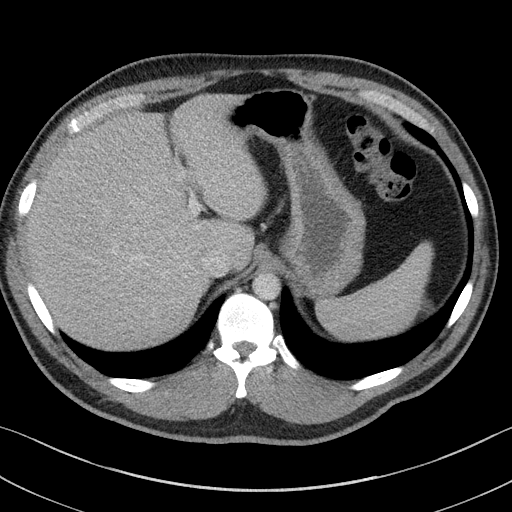
[im 92/105  soft-tissue]
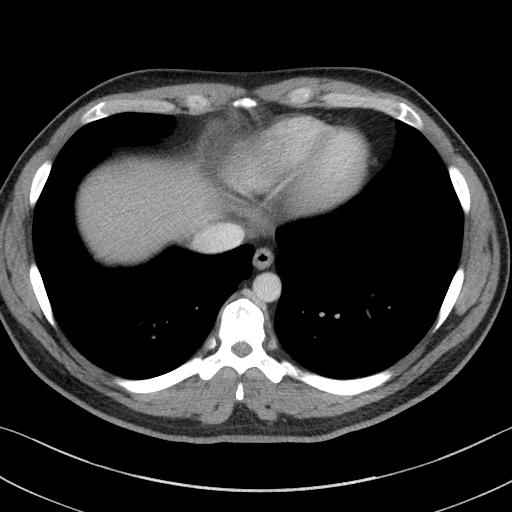
[im 100/105  soft-tissue]
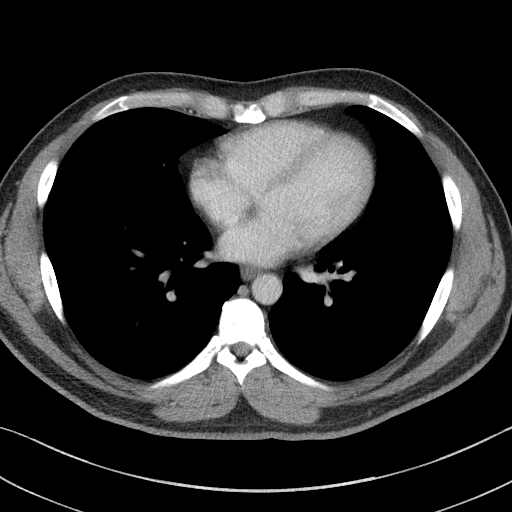

[Series 6: a/p w/ cor · coronal · 0.84mm/px · 3 of 119 slices shown]
[im 40/119  soft-tissue]
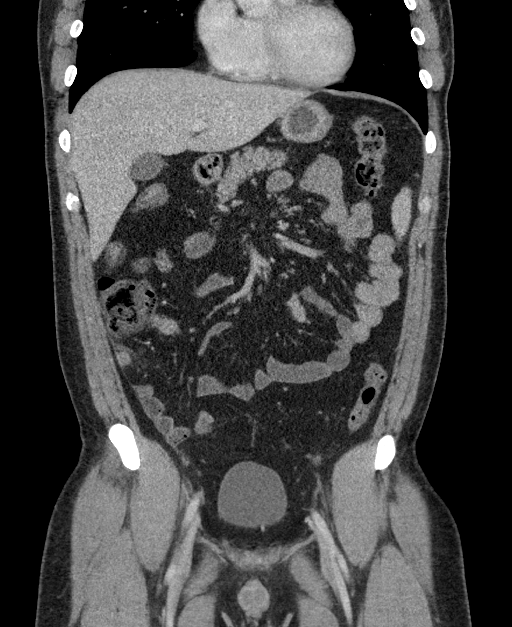
[im 53/119  soft-tissue]
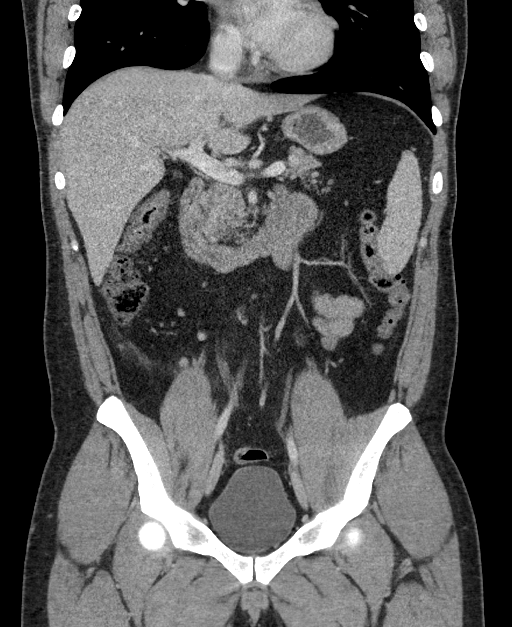
[im 66/119  soft-tissue]
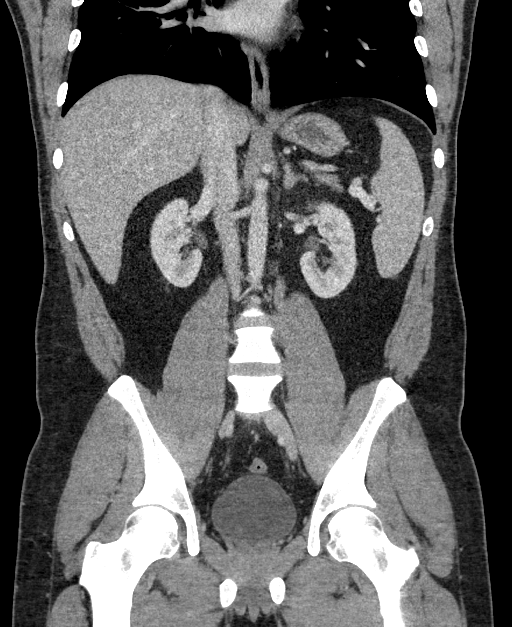

[15 of 46 positions shown; findings below may reference images not displayed]

FINDINGS: Lower chest: Lung bases are clear.

Hepatobiliary: There is an apparent hemangioma in the posterior
segment of the right lobe of the liver measuring 2.7 x 1.2 cm. No
other focal liver lesions are evident. Gallbladder wall is not
appreciably thickened. There is no biliary duct dilatation.

Pancreas: There is no pancreatic mass or inflammatory focus.

Spleen: Spleen measures 14.1 x 12.9 x 6.2 cm with a measured splenic
volume of 564 cubic cm. There are small cysts along the periphery of
the spleen laterally. Spleen otherwise appears unremarkable.

Adrenals/Urinary Tract: Adrenals appear normal bilaterally. Kidneys
bilaterally show no evident mass or hydronephrosis on either side.
There is no renal or ureteral calculus on either side. Urinary
bladder is midline with wall thickness within normal limits.

Stomach/Bowel: There is no appreciable bowel wall or mesenteric
thickening. No evident bowel obstruction. No free air or portal
venous air.

Vascular/Lymphatic: No abdominal aortic aneurysm. No vascular lesion
evident. There is no appreciable adenopathy in the abdomen or
pelvis.

Reproductive: Prostate and seminal vesicles appear normal in size
and contour. No pelvic mass evident.

Other: The appendix is enlarged, measuring 11 mm in diameter. There
is periappendiceal soft tissue thickening and stranding. These are
findings indicative of acute appendiceal inflammation. No abscess or
perforation is evident in the appendiceal region.

No abscess or ascites noted elsewhere in the abdomen or pelvis.
There is a rather minimal ventral hernia containing only fat.

Musculoskeletal: There are no blastic or lytic bone lesions. There
is no intramuscular or abdominal wall lesion.
IMPRESSION: 1. Findings indicative of acute appendiceal inflammation. No abscess
or perforation evident in the appendiceal region.

2.  Splenomegaly.  Small cysts in the periphery of the spleen.

3. Hemangioma in the posterior segment of the right lobe of the
liver inferiorly.

4.  Small ventral hernia containing only fat.

5.  No abscess.  No bowel obstruction.

6.  No renal or ureteral calculus.  No hydronephrosis.

Critical Value/emergent results were called by telephone at the time
of interpretation on 07/19/2017 at [DATE] to Dr. MUKIT MANOHAR ,
who verbally acknowledged these results.

## 2018-08-10 NOTE — Progress Notes (Signed)
Subjective:    Muhammadali Ries is a 37 y.o. male who presents today for his Complete Annual Exam.   No current outpatient medications on file.  Health Maintenance Due  Topic Date Due  . HIV Screening  07/09/1997  . INFLUENZA VACCINE  07/02/2018    PMHx, SurgHx, SocialHx, Medications, and Allergies were reviewed in the Visit Navigator and updated as appropriate.   No past medical history on file.   Past Surgical History:  Procedure Laterality Date  . LAPAROSCOPIC APPENDECTOMY N/A 07/19/2017   Procedure: APPENDECTOMY LAPAROSCOPIC;  Surgeon: Clovis Riley, MD;  Location: [facility] OR;  Service: General;  Laterality: N/A;  . VASECTOMY       Family History  Problem Relation Age of Onset  . Hyperlipidemia Father   . Cancer Maternal Grandmother   . Hypertension Maternal Grandfather     Social History   Tobacco Use  . Smoking status: Never Smoker  . Smokeless tobacco: Never Used  Substance Use Topics  . Alcohol use: Yes    Comment: occasional  . Drug use: No    Review of Systems:   Pertinent items are noted in the HPI. Otherwise, ROS is negative.  Objective:   Vitals:   08/11/18 1408  BP: 130/78  Pulse: 79  Temp: 98.4 F (36.9 C)  SpO2: 98%   Body mass index is 28.59 kg/m.  General Appearance:  Alert, cooperative, no distress, appears stated age  Head:  Normocephalic, without obvious abnormality, atraumatic  Eyes:  PERRL, conjunctiva/corneas clear, EOM's intact, fundi benign, both eyes       Ears:  Normal TM's and external ear canals, both ears  Nose: Nares normal, septum midline, mucosa normal, no drainage    or sinus tenderness  Throat: Lips, mucosa, and tongue normal; teeth and gums normal  Neck: Supple, symmetrical, trachea midline, no adenopathy; thyroid:  No enlargement/tenderness/nodules; no carotit bruit or JVD  Back:   Symmetric, no curvature, ROM normal, no CVA tenderness  Lungs:   Clear to auscultation bilaterally, respirations unlabored    Chest wall:  No tenderness or deformity  Heart:  Regular rate and rhythm, S1 and S2 normal, no murmur, rub   or gallop  Abdomen:   Soft, non-tender, bowel sounds active all four quadrants, no masses, no organomegaly  Extremities: Extremities normal, atraumatic, no cyanosis or edema  Prostate: Not done.   Skin: Skin color, texture, turgor normal, no rashes or lesions  Lymph nodes: Cervical, supraclavicular, and axillary nodes normal  Neurologic: CNII-XII grossly intact. Normal strength, sensation and reflexes throughout    Assessment/Plan:   [place] was seen today for annual exam.  Diagnoses and all orders for this visit:  Routine health maintenance  Encounter for screening for HIV -     HIV antibody  Need for immunization against influenza -     Flu Vaccine QUAD 36+ mos IM  Pure hypercholesterolemia -     Comprehensive metabolic panel -     Lipid panel    Patient Counseling: [x]   Nutrition: Stressed importance of moderation in sodium/caffeine intake, saturated fat and cholesterol, caloric balance, sufficient intake of fresh fruits, vegetables, and fiber.  [x]   Stressed the importance of regular exercise.   []   Substance Abuse: Discussed cessation/primary prevention of tobacco, alcohol, or other drug use; driving or other dangerous activities under the influence; availability of treatment for abuse.   [x]   Injury prevention: Discussed safety belts, safety helmets, smoke detector, smoking near bedding or upholstery.   []   Sexuality: Discussed sexually transmitted diseases, partner selection, use of condoms, avoidance of unintended pregnancy and contraceptive alternatives.   [x]   Dental health: Discussed importance of regular tooth brushing, flossing, and dental visits.  [x]   Health maintenance and immunizations reviewed. Please refer to Health maintenance section.    Briscoe Deutscher, DO [place]

## 2018-08-11 ENCOUNTER — Ambulatory Visit (INDEPENDENT_AMBULATORY_CARE_PROVIDER_SITE_OTHER): Payer: Managed Care, Other (non HMO) | Admitting: Family Medicine

## 2018-08-11 ENCOUNTER — Encounter: Payer: Self-pay | Admitting: Family Medicine

## 2018-08-11 VITALS — BP 130/78 | HR 79 | Temp 98.4°F | Ht 68.0 in | Wt 188.0 lb

## 2018-08-11 DIAGNOSIS — Z23 Encounter for immunization: Secondary | ICD-10-CM

## 2018-08-11 DIAGNOSIS — Z114 Encounter for screening for human immunodeficiency virus [HIV]: Secondary | ICD-10-CM | POA: Diagnosis not present

## 2018-08-11 DIAGNOSIS — Z0000 Encounter for general adult medical examination without abnormal findings: Secondary | ICD-10-CM | POA: Diagnosis not present

## 2018-08-11 DIAGNOSIS — E7800 Pure hypercholesterolemia, unspecified: Secondary | ICD-10-CM

## 2018-08-11 LAB — COMPREHENSIVE METABOLIC PANEL
ALT: 18 U/L (ref 0–53)
AST: 18 U/L (ref 0–37)
Albumin: 4.6 g/dL (ref 3.5–5.2)
Alkaline Phosphatase: 58 U/L (ref 39–117)
BUN: 19 mg/dL (ref 6–23)
CO2: 28 mEq/L (ref 19–32)
Calcium: 9.5 mg/dL (ref 8.4–10.5)
Chloride: 104 mEq/L (ref 96–112)
Creatinine, Ser: 1.04 mg/dL (ref 0.40–1.50)
GFR: 85.84 mL/min (ref >60.00)
Glucose, Bld: 86 mg/dL (ref 70–99)
Potassium: 3.8 mEq/L (ref 3.5–5.1)
Sodium: 139 mEq/L (ref 135–145)
Total Bilirubin: 0.9 mg/dL (ref 0.2–1.2)
Total Protein: 7.1 g/dL (ref 6.0–8.3)

## 2018-08-11 LAB — LIPID PANEL
Cholesterol: 157 mg/dL (ref 0–200)
HDL: 45.5 mg/dL
LDL Cholesterol: 87 mg/dL (ref 0–99)
NonHDL: 111.26
Total CHOL/HDL Ratio: 3
Triglycerides: 120 mg/dL (ref 0.0–149.0)
VLDL: 24 mg/dL (ref 0.0–40.0)

## 2018-08-11 NOTE — Patient Instructions (Signed)
..  It takes about 2 weeks for protection to develop after vaccination.  There are many flu viruses, and they are always changing. Each year a new flu vaccine is made to protect against three or four viruses that are likely to cause disease in the upcoming flu season. Even when the vaccine doesn't exactly match these viruses, it may still provide some protection.   Influenza vaccine does not cause flu.  Influenza vaccine may be given at the same time as other vaccines.  3. Talk with your health care provider  Tell your vaccine provider if the person getting the vaccine: ; Has had an allergic reaction after a previous dose of influenza vaccine, or has any severe, life-threatening allergies.  ; Has ever had Guillain-Barr Syndrome (also called GBS).  In some cases, your health care provider may decide to postpone influenza vaccination to a future visit.  People with minor illnesses, such as a cold, may be vaccinated. People who are moderately or severely ill should usually wait until they recover before getting influenza vaccine.  Your health care provider can give you more information.  4. Risks of a reaction  ; Soreness, redness, and swelling where shot is given, fever, muscle aches, and headache can happen after influenza vaccine. ; There may be a very small increased risk of Guillain-Barr Syndrome (GBS) after inactivated influenza vaccine (the flu shot).  Young children who get the flu shot along with pneumococcal vaccine (PCV13), and/or DTaP vaccine at the same time might be slightly more likely to have a seizure caused by fever. Tell your health care provider if a child who is getting flu vaccine has ever had a seizure.  People sometimes faint after medical procedures, including vaccination. Tell your provider if you feel dizzy or have vision changes or ringing in the ears.  As with any medicine, there is a very remote chance of a vaccine causing a severe allergic reaction, other  serious injury, or death.  5. What if there is a serious problem?  An allergic reaction could occur after the vaccinated person leaves the clinic. If you see signs of a severe allergic reaction (hives, swelling of the face and throat, difficulty breathing, a fast heartbeat, dizziness, or weakness), call 9-1-1 and get the person to the nearest hospital.  For other signs that concern you, call your health care provider.  Adverse reactions should be reported to the Vaccine Adverse Event Reporting System (VAERS). Your health care provider will usually file this report, or you can do it yourself. Visit the VAERS website at www.vaers.hhs.gov or call 1-800-822-7967.  VAERS is only for reporting reactions, and VAERS staff do not give medical advice.  6. The National Vaccine Injury Compensation Program  The National Vaccine Injury Compensation Program (VICP) is a federal program that was created to compensate people who may have been injured by certain vaccines. Visit the VICP website at www.hrsa.gov/vaccinecompensation or call 1-800-338-2382 to learn about the program and about filing a claim. There is a time limit to file a claim for compensation.  7. How can I learn more?  ; Ask your health care provider.  ; Call your local or state health department. ; Contact the Centers for Disease Control and Prevention (CDC): - Call 1-800-232-4636 (1-800-CDC-INFO) or - Visit CDC's influenza website at www.cdc.gov/flu  Vaccine Information Statement (Interim) Inactivated Influenza Vaccine  07/16/2018 42 U.S.C.  300aa-26   Department of Health and Human Services Centers for Disease Control and Prevention  Office Use Only  

## 2018-08-12 LAB — HIV ANTIBODY (ROUTINE TESTING W REFLEX): HIV 1&2 Ab, 4th Generation: NONREACTIVE

## 2018-08-17 ENCOUNTER — Telehealth: Payer: Self-pay

## 2018-08-17 NOTE — Telephone Encounter (Signed)
Copied from [place] [phone]. Topic: General - Other >> Aug 17, 2018  8:42 AM Judyann Munson wrote: Reason for CRM: Patient is calling to request information  on a price for a minor surgery  with a cpt code 11310 with diagnosed code:  D22.30. I gave the patient the contact number for billing but He is requesting a call from the office. Please advise

## 2018-08-17 NOTE — Telephone Encounter (Signed)
Left message for patient to return call.

## 2018-08-21 NOTE — Telephone Encounter (Signed)
Left message for patient to return call.

## 2018-08-26 NOTE — Telephone Encounter (Signed)
Patient did not return call.

## 2019-11-29 DIAGNOSIS — Z20828 Contact with and (suspected) exposure to other viral communicable diseases: Secondary | ICD-10-CM | POA: Diagnosis not present

## 2020-03-13 DIAGNOSIS — R519 Headache, unspecified: Secondary | ICD-10-CM | POA: Diagnosis not present

## 2020-03-13 DIAGNOSIS — R0981 Nasal congestion: Secondary | ICD-10-CM | POA: Diagnosis not present

## 2020-03-13 DIAGNOSIS — Z20822 Contact with and (suspected) exposure to covid-19: Secondary | ICD-10-CM | POA: Diagnosis not present

## 2020-03-15 DIAGNOSIS — Z20828 Contact with and (suspected) exposure to other viral communicable diseases: Secondary | ICD-10-CM | POA: Diagnosis not present

## 2020-03-15 DIAGNOSIS — Z03818 Encounter for observation for suspected exposure to other biological agents ruled out: Secondary | ICD-10-CM | POA: Diagnosis not present

## 2020-03-23 DIAGNOSIS — Z20828 Contact with and (suspected) exposure to other viral communicable diseases: Secondary | ICD-10-CM | POA: Diagnosis not present

## 2020-03-23 DIAGNOSIS — Z03818 Encounter for observation for suspected exposure to other biological agents ruled out: Secondary | ICD-10-CM | POA: Diagnosis not present

## 2020-08-14 DIAGNOSIS — Z20822 Contact with and (suspected) exposure to covid-19: Secondary | ICD-10-CM | POA: Diagnosis not present

## 2020-08-19 DIAGNOSIS — U071 COVID-19: Secondary | ICD-10-CM | POA: Diagnosis not present

## 2021-02-02 ENCOUNTER — Encounter: Payer: Self-pay | Admitting: Family Medicine

## 2021-02-02 ENCOUNTER — Ambulatory Visit: Payer: BC Managed Care – PPO | Admitting: Family Medicine

## 2021-02-02 ENCOUNTER — Other Ambulatory Visit: Payer: Self-pay

## 2021-02-02 VITALS — BP 122/78 | HR 69 | Temp 97.7°F | Wt 198.0 lb

## 2021-02-02 DIAGNOSIS — L089 Local infection of the skin and subcutaneous tissue, unspecified: Secondary | ICD-10-CM

## 2021-02-02 DIAGNOSIS — Z23 Encounter for immunization: Secondary | ICD-10-CM

## 2021-02-02 DIAGNOSIS — S61432A Puncture wound without foreign body of left hand, initial encounter: Secondary | ICD-10-CM | POA: Diagnosis not present

## 2021-02-02 MED ORDER — CEPHALEXIN 500 MG PO CAPS: 500.0000 mg | ORAL_CAPSULE | Freq: Two times a day (BID) | ORAL | 0 refills | Status: DC

## 2021-02-02 NOTE — Progress Notes (Signed)
Subjective  CC:  Chief Complaint  Patient presents with  . Finger Injury    Open wound left thumb, screw went into side of thumb. Incident happened yesterday    HPI: Christopher Hoffman is a 40 y.o. male who presents to the office today to address the problems listed above in the chief complaint.  40 year old male who is using a drill screw accidentally punctured to his left thumb yesterday afternoon.  He quickly cleaned and kept working.  This morning awoke with increased swelling and stiffness.  He was able to express a little bit of blood possible small amount of pus.  He is able to use his thumb normally.  No weakness or limitations.  Puncture site is small.  He is due for his Tdap.  He has had no systemic symptoms.   Assessment  1. Puncture wound of left hand with infection, initial encounter      Plan   Puncture wound with infection, left thumb: No obvious foreign body.  No apparent tendon injuries.  Tdap given today.  Keflex 500 twice daily for 7 days.  Wound care discussed.  Follow-up if not proving.  Follow up: Return if symptoms worsen or fail to improve.  Recommend establishing with a new primary care provider Visit date not found  No orders of the defined types were placed in this encounter.  Meds ordered this encounter  Medications  . cephALEXin (KEFLEX) 500 MG capsule    Sig: Take 1 capsule (500 mg total) by mouth 2 (two) times daily.    Dispense:  14 capsule    Refill:  0      I reviewed the patients updated PMH, FH, and SocHx.    Patient Active Problem List   Diagnosis Date Noted  . Hyperlipidemia 03/28/2017   Current Meds  Medication Sig  . cephALEXin (KEFLEX) 500 MG capsule Take 1 capsule (500 mg total) by mouth 2 (two) times daily.    Allergies: Patient has No Known Allergies. Family History: Patient family history includes Cancer in his maternal grandmother; Hyperlipidemia in his father; Hypertension in his maternal grandfather. Social  History:  Patient  reports that he has never smoked. He has never used smokeless tobacco. He reports current alcohol use. He reports that he does not use drugs.  Review of Systems: Constitutional: Negative for fever malaise or anorexia Cardiovascular: negative for chest pain Respiratory: negative for SOB or persistent cough Gastrointestinal: negative for abdominal pain  Objective  Vitals: BP 122/78   Pulse 69   Temp 97.7 F (36.5 C) (Temporal)   Wt 198 lb (89.8 kg)   SpO2 98%   BMI 30.11 kg/m  General: no acute distress , A&Ox3 Left: Small puncture wound near DIP.  Normal range of motion, normal strength, mild swelling present.  No obvious drainage or pus.  No fluctuance.    Commons side effects, risks, benefits, and alternatives for medications and treatment plan prescribed today were discussed, and the patient expressed understanding of the given instructions. Patient is instructed to call or message via MyChart if he/she has any questions or concerns regarding our treatment plan. No barriers to understanding were identified. We discussed Red Flag symptoms and signs in detail. Patient expressed understanding regarding what to do in case of urgent or emergency type symptoms.   Medication list was reconciled, printed and provided to the patient in AVS. Patient instructions and summary information was reviewed with the patient as documented in the AVS. This note was prepared with  assistance of Systems analyst. Occasional wrong-word or sound-a-like substitutions may have occurred due to the inherent limitations of voice recognition software  This visit occurred during the SARS-CoV-2 public health emergency.  Safety protocols were in place, including screening questions prior to the visit, additional usage of staff PPE, and extensive cleaning of exam room while observing appropriate contact time as indicated for disinfecting solutions.

## 2021-02-02 NOTE — Patient Instructions (Addendum)
Please schedule a complete physical to get established with another provider here in the office. Your last physical was in 2019. We recommend a physical with blood work annually.  Today you were given your Tdap vaccination. This is good for 10 years  Puncture Wound A puncture wound is an injury that is caused by a sharp, thin object that goes through (penetrates) your skin. Usually, a puncture wound does not leave a large opening in your skin, so it may not bleed a lot. However, when you get a puncture wound, dirt or other materials (foreign bodies) can be forced into your wound and can break off inside. This increases the chance of infection, such as tetanus. There are many sharp, pointed objects that can cause puncture wounds, including teeth, nails, splinters of glass, fishhooks, and needles. Treatment may include washing out the wound with a germ-free (sterile) salt-water solution, having the wound opened surgically to remove a foreign object, closing the wound with stitches (sutures), and covering the wound with antibiotic ointment and a bandage (dressing). Depending on what caused the injury, you may also need a tetanus shot or a rabies shot. Follow these instructions at home: Medicines  Take or apply over-the-counter and prescription medicines only as told by your health care provider.  If you were prescribed an antibiotic medicine, take or apply it as told by your health care provider. Do not stop using the antibiotic even if your condition improves. Bathing  Keep the dressing dry as told by your health care provider.  Do not take baths, swim, or use a hot tub until your health care provider approves. Ask your health care provider if you may take showers. You may only be allowed to take sponge baths. Wound care  There are many ways to close and cover a wound. For example, a wound can be closed with sutures, skin glue, or adhesive strips. Follow instructions from your health care provider  about how to take care of your wound. Make sure you: ? Wash your hands with soap and water before and after you change your dressing. If soap and water are not available, use hand sanitizer. ? Change your dressing as told by your health care provider. ? Leave sutures, skin glue, or adhesive strips in place. These skin closures may need to stay in place for 2 weeks or longer. If adhesive strip edges start to loosen and curl up, you may trim the loose edges. Do not remove adhesive strips completely unless your health care provider tells you to do that.  Clean the wound as told by your health care provider.  Do not scratch or pick at the wound.  Check your wound every day for signs of infection. Check for: ? Redness, swelling, or pain. ? Fluid or blood. ? Warmth. ? Pus or a bad smell.   General instructions  Raise (elevate) the injured area above the level of your heart while you are sitting or lying down.  If your puncture wound is in your foot, ask your health care provider if you need to avoid putting weight on your foot and for how long. Do not use the injured limb to support your body weight until your health care provider says that you can. Use crutches as told by your health care provider.  Keep all follow-up visits as told by your health care provider. This is important. Contact a health care provider if:  You received a tetanus shot and you have swelling, severe pain, redness, or bleeding  at the injection site.  You have a fever.  Your sutures come out.  You notice a bad smell coming from your wound or your dressing.  You notice something coming out of your wound, such as wood or glass.  Your pain is not controlled with medicine.  You have increased redness, swelling, or pain at the site of your wound.  You have fluid, blood, or pus coming from your wound.  You notice a change in the color of your skin near your wound.  You need to change the dressing frequently due to  fluid, blood, or pus draining from your wound.  You develop a new rash.  You develop numbness around your wound.  You have warmth around your wound. Get help right away if:  You develop severe swelling around your wound.  Your pain suddenly increases and is severe.  You develop painful skin lumps.  You have a red streak going away from your wound.  The wound is on your hand or foot and you: ? Cannot properly move a finger or toe. ? Notice that your fingers or toes look pale or bluish. Summary  A puncture wound is an injury that is caused by a sharp, thin object that goes through (penetrates) your skin.  Treatment may include washing out the wound, having the wound opened surgically to remove a foreign object, closing the wound with stitches (sutures), and covering the wound with antibiotic ointment and a bandage (dressing).  Follow instructions from your health care provider about how to take care of your wound.  Contact a health care provider if you have increased redness, swelling, or pain at the site of your wound.  Keep all follow-up visits as told by your health care provider. This is important. This information is not intended to replace advice given to you by your health care provider. Make sure you discuss any questions you have with your health care provider. Document Revised: 03/28/2020 Document Reviewed: 06/25/2018 Elsevier Patient Education  2021 [employer].

## 2021-05-01 ENCOUNTER — Encounter: Payer: BC Managed Care – PPO | Admitting: Family Medicine

## 2022-06-13 ENCOUNTER — Ambulatory Visit (INDEPENDENT_AMBULATORY_CARE_PROVIDER_SITE_OTHER): Payer: BC Managed Care – PPO

## 2022-06-13 ENCOUNTER — Ambulatory Visit (INDEPENDENT_AMBULATORY_CARE_PROVIDER_SITE_OTHER): Payer: BC Managed Care – PPO | Admitting: Family Medicine

## 2022-06-13 VITALS — BP 110/80 | HR 67 | Ht 68.0 in | Wt 199.2 lb

## 2022-06-13 DIAGNOSIS — M50322 Other cervical disc degeneration at C5-C6 level: Secondary | ICD-10-CM | POA: Diagnosis not present

## 2022-06-13 DIAGNOSIS — M5010 Cervical disc disorder with radiculopathy, unspecified cervical region: Secondary | ICD-10-CM

## 2022-06-13 DIAGNOSIS — M50323 Other cervical disc degeneration at C6-C7 level: Secondary | ICD-10-CM | POA: Diagnosis not present

## 2022-06-13 MED ORDER — PREDNISONE 50 MG PO TABS: 50.0000 mg | ORAL_TABLET | Freq: Every day | ORAL | 0 refills | Status: DC

## 2022-06-13 MED ORDER — GABAPENTIN 300 MG PO CAPS: 300.0000 mg | ORAL_CAPSULE | Freq: Three times a day (TID) | ORAL | 3 refills | Status: DC | PRN

## 2022-06-13 NOTE — Progress Notes (Signed)
   I, Peterson Lombard, LAT, ATC acting as a scribe for Lynne Leader, MD.  Subjective:    CC: Neck pain  HPI: Pt is a 41 y/o male c/o neck pain w/ tingling into his R arm a couple months. Pt locates pain to the posterior aspect of his R arm. Pt also has a point tender area over his thoracic spine. Pt describes pain as a "dull ache" with tingling.  Radiates: yes UE numbness/tingling: yes- R arm, 4th-5th fingers UE weakness: no Aggravates: sitting, chin tuck, at rest Treatments tried: IBU   Pertinent review of Systems: No fevers or chills  Relevant historical information: Hyperlipidemia.  Otherwise healthy participates in F3 exercise.   Objective:    Vitals:   06/13/22 1259  BP: 110/80  Pulse: 67  SpO2: 98%   General: Well Developed, well nourished, and in no acute distress.   MSK: C-spine: Normal appearing Mildly tender to palpation lower portion cervical spine midline. Normal cervical motion. Positive right-sided Spurling's test. Upper extremity strength is intact. Reflexes are intact. Sensation is intact.  Right elbow: Normal appearing. Normal motion. Persistent flexion does not reproduce paresthesias. Positive Tinel's at cubital tunnel mildly. Normal wrist motion.  Normal strength.      Lab and Radiology Results X-ray images C-spine obtained today personally and independently interpreted. No acute fractures.  Loss of cervical lordosis. Minimal degenerative changes. Await formal radiology review  Impression and Recommendations:    Assessment and Plan: 41 y.o. male with right arm paresthesias and pain in a C8 dermatomal pattern.  Differential diagnosis does include ulnar neuropathy but this is less likely.  Plan for short course of prednisone and trial of gabapentin.  We will proceed to physical therapy.  If not improved consider MRI.  Additionally work on Personal assistant..  Recheck in 6 weeks.  PDMP not reviewed this encounter. Orders Placed This Encounter   Procedures   DG Cervical Spine 2 or 3 views    Standing Status:   Future    Number of Occurrences:   1    Standing Expiration Date:   06/14/2023    Order Specific Question:   Reason for Exam (SYMPTOM  OR DIAGNOSIS REQUIRED)    Answer:   cervical radicular pain    Order Specific Question:   Preferred imaging location?    Answer:   [facility]   Ambulatory referral to Physical Therapy    Referral Priority:   Routine    Referral Type:   Physical Medicine    Referral Reason:   Specialty Services Required    Requested Specialty:   Physical Therapy    Number of Visits Requested:   1   Meds ordered this encounter  Medications   predniSONE (DELTASONE) 50 MG tablet    Sig: Take 1 tablet (50 mg total) by mouth daily.    Dispense:  5 tablet    Refill:  0   gabapentin (NEURONTIN) 300 MG capsule    Sig: Take 1 capsule (300 mg total) by mouth 3 (three) times daily as needed.    Dispense:  90 capsule    Refill:  3    Discussed warning signs or symptoms. Please see discharge instructions. Patient expresses understanding.   The above documentation has been reviewed and is accurate and complete Lynne Leader, M.D.

## 2022-06-13 NOTE — Patient Instructions (Addendum)
Thank you for coming in today.   Please get an Xray today before you leave   I've sent a prescription for Gabapentin and Prednisone to your pharmacy.   I've referred you to Physical Therapy.  Let us know if you don't hear from them in one week.   Check back in 6 weeks

## 2022-06-17 NOTE — Progress Notes (Signed)
Cervical spine x-ray shows mild arthritis changes.

## 2022-07-25 ENCOUNTER — Ambulatory Visit: Payer: BC Managed Care – PPO | Admitting: Family Medicine

## 2023-01-07 DIAGNOSIS — S61412A Laceration without foreign body of left hand, initial encounter: Secondary | ICD-10-CM | POA: Diagnosis not present

## 2023-01-20 DIAGNOSIS — S61412A Laceration without foreign body of left hand, initial encounter: Secondary | ICD-10-CM | POA: Diagnosis not present

## 2023-07-02 ENCOUNTER — Encounter (INDEPENDENT_AMBULATORY_CARE_PROVIDER_SITE_OTHER): Payer: Self-pay

## 2023-07-28 ENCOUNTER — Ambulatory Visit: Payer: Commercial Managed Care - HMO | Admitting: Internal Medicine

## 2023-07-28 ENCOUNTER — Encounter: Payer: Self-pay | Admitting: Internal Medicine

## 2023-07-28 VITALS — BP 138/82 | HR 71 | Temp 97.9°F | Ht 68.0 in | Wt 204.6 lb

## 2023-07-28 DIAGNOSIS — R638 Other symptoms and signs concerning food and fluid intake: Secondary | ICD-10-CM | POA: Diagnosis not present

## 2023-07-28 DIAGNOSIS — R053 Chronic cough: Secondary | ICD-10-CM | POA: Diagnosis not present

## 2023-07-28 DIAGNOSIS — L989 Disorder of the skin and subcutaneous tissue, unspecified: Secondary | ICD-10-CM | POA: Diagnosis not present

## 2023-07-28 DIAGNOSIS — M5030 Other cervical disc degeneration, unspecified cervical region: Secondary | ICD-10-CM | POA: Insufficient documentation

## 2023-07-28 DIAGNOSIS — Z579 Occupational exposure to unspecified risk factor: Secondary | ICD-10-CM | POA: Diagnosis not present

## 2023-07-28 HISTORY — DX: Other cervical disc degeneration, unspecified cervical region: M50.30

## 2023-07-28 NOTE — Assessment & Plan Note (Signed)
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 We advised him to continue strict use of protective equipment, particularly respirators, to minimize pulmonary exposure.

## 2023-07-28 NOTE — Assessment & Plan Note (Signed)
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 It has been photographed for future reference and comparison. Cosmetic removal can be considered if he desires.

## 2023-07-28 NOTE — Progress Notes (Signed)
[facility]: [phone]   -- Annual Preventive Medical Office Visit --  Patient:  Christopher Hoffman      Age: 42 y.o.       Sex:  male  Date:   07/28/2023 Patient Care Team: Lula Olszewski, MD as PCP - General (Internal Medicine) Today's Healthcare Provider: Lula Olszewski, MD   Chief Complaint  Patient presents with   Transfer of care   Red spot on abdomen    No associated symptoms.   Cough    Mild, intermittent and can be productive at times, to clear lungs.    Today was just a meet and greet to re-establish, he had another appointment so will return for annual. 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 It has been photographed for future reference and comparison. Cosmetic removal can be considered if he desires. Chronic cough Will let me know if it persists, very mild, will hold off on CXR and sinus XR. He has had a persistent cough since February, possibly stemming from a previous cold, with no clear pattern or triggers identified, suggesting a possible environmental or allergic component. We will start a trial of Flonase to assess for a possible allergic etiology and consider further evaluation if symptoms persist. 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 We advised him to continue strict use of protective equipment, particularly respirators, to minimize pulmonary exposure.  We will schedule a wellness exam in two months for a comprehensive health assessment, vaccinations, and lab work.      Subjective  AI-Extracted: Discussed the use of AI scribe software for clinical note transcription with the patient, who gave verbal consent to proceed.  History of Present  Illness   The patient, previously seen in this practice, presents for a routine check-up with no pressing concerns. He describes himself as generally healthy and proactive about his health, preferring to use the medical system responsibly. He has a few minor issues he wanted to discuss, including a small raised mole on his abdomen and a lingering cough.  The mole, which has irregular edges, has been present for an unknown duration. The patient reports no changes in size, color, or symptoms associated with the mole.  The cough, described as deep and transient, began after a trip to [facility] several months ago. He reports that the cough has mostly resolved but occasionally experiences a sensation of needing to clear his lungs, particularly after deep breaths. This occurs a few times a day and is not associated with exertion or specific times of the day. The patient also noted that the cough seemed to improve during a recent trip to the beach, leading him to suspect a possible environmental or allergy-related cause.  The patient also has a history of a work-related hand injury, which required suturing, and a past neck issue, suspected to be a bad disc in the cervical spine. Both issues have since resolved.  The patient has a history of appendectomy and has been noted to have elevated triglycerides in the past. He is not currently on any medications. The patient also owns a floor coatings business, which involves exposure to concrete dust and epoxy, and he is aware of the potential pulmonary risks associated with this work.  Problem  Skin Lesion   Photographs Taken 07/28/2023 : suspicious capillary hemangioma, will monitor.       Chronic Cough   Since February 2024. 98% gone since May 2024. 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      Weight Disorder  Occupational Exposure in Workplace  Ddd (Degenerative Disc Disease), Cervical (Resolved)   No current outpatient medications on file prior to  visit.   No current facility-administered medications on file prior to visit.   Medications Discontinued During This Encounter  Medication Reason   predniSONE (DELTASONE) 50 MG tablet Completed Course   gabapentin (NEURONTIN) 300 MG capsule Completed Course   The following were reviewed and entered/updated into our electronic MEDICAL RECORD NUMBER   07/28/2023   10:09 AM  Depression screen PHQ 2/9  Decreased Interest 0  Down, Depressed, Hopeless 0  PHQ - 2 Score 0   Past Medical History:  Diagnosis Date   DDD (degenerative disc disease), cervical 07/28/2023   Patient Active Problem List   Diagnosis Date Noted   Skin lesion 07/28/2023   Chronic cough 07/28/2023   Weight disorder 07/28/2023   Occupational exposure in workplace 07/28/2023   Hyperlipidemia 03/28/2017   Past Surgical History:  Procedure Laterality Date   LAPAROSCOPIC APPENDECTOMY N/A 07/19/2017   Procedure: APPENDECTOMY LAPAROSCOPIC;  Surgeon: Berna Bue, MD;  Location: [facility] OR;  Service: General;  Laterality: N/A;   VASECTOMY     Family History  Problem Relation Age of Onset   Stroke Father    Hyperlipidemia Father    Cancer Maternal Grandmother    Hypertension Maternal Grandfather    No Known Allergies Social History   Tobacco Use   Smoking status: Never   Smokeless tobacco: Never  Vaping Use   Vaping status: Never Used  Substance Use Topics   Alcohol use: Yes    Comment: occasional   Drug use: No     Objective  BP 138/82 (BP Location: Right Arm, Patient Position: Sitting)   Pulse 71   Temp 97.9 F (36.6 C) (Temporal)   Ht 5\' 8"  (1.727 m)   Wt 204 lb 9.6 oz (92.8 kg)   SpO2 99%   BMI 31.11 kg/m   Body mass index is 31.11 kg/m. Wt Readings from Last 3 Encounters:  07/28/23 204 lb 9.6 oz (92.8 kg)  06/13/22 199 lb 3.2 oz (90.4 kg)  02/02/21 198 lb (89.8 kg)   This is a polite, friendly, and genuine person who was a pleasure to meet. Truncal adiposity  Constitutional: NAD, AAO, not  ill-appearing  HENT:  NCAT, normal nose, mucous membranes moist Eyes:  sclera nonicteric, no injection Skin: warm, dry, no lesions of concern photo taken Neuro: alert, no focal deficit obvious, articulate speech Psych: normal mood, behavior, thought content

## 2023-07-28 NOTE — Assessment & Plan Note (Addendum)
Will let me know if it persists, very mild, will hold off on CXR and sinus XR. He has had a persistent cough since February, possibly stemming from a previous cold, with no clear pattern or triggers identified, suggesting a possible environmental or allergic component. We will start a trial of Flonase to assess for a possible allergic etiology and consider further evaluation if symptoms persist.

## 2023-07-28 NOTE — Assessment & Plan Note (Signed)
Mild truncal adiposity

## 2023-10-09 ENCOUNTER — Ambulatory Visit: Payer: Managed Care, Other (non HMO) | Admitting: Internal Medicine

## 2023-10-09 ENCOUNTER — Encounter: Payer: Self-pay | Admitting: Internal Medicine

## 2023-10-09 VITALS — BP 130/78 | HR 76 | Temp 97.4°F | Ht 68.0 in | Wt 198.6 lb

## 2023-10-09 DIAGNOSIS — E65 Localized adiposity: Secondary | ICD-10-CM | POA: Diagnosis not present

## 2023-10-09 DIAGNOSIS — Z0001 Encounter for general adult medical examination with abnormal findings: Secondary | ICD-10-CM

## 2023-10-09 DIAGNOSIS — Z0000 Encounter for general adult medical examination without abnormal findings: Secondary | ICD-10-CM

## 2023-10-09 NOTE — Addendum Note (Signed)
Addended by: Donzetta Starch on: 10/09/2023 09:39 AM   Modules accepted: Orders

## 2023-10-09 NOTE — Progress Notes (Signed)
[facility]: [phone]   -- Annual Preventive Medical Office Visit --  Patient:  Christopher Hoffman      Age: 42 y.o.       Sex:  male  Date:   10/09/2023 Patient Care Team: Lula Olszewski, MD as PCP - General (Internal Medicine) Today's Healthcare Provider: Lula Olszewski, MD  ========================================= Chief Complaint  Patient presents with   Annual Exam    Purpose of Visit: Comprehensive preventive health assessment and personalized health maintenance planning.  This encounter was conducted as a Comprehensive Physical Exam (CPE) preventive care annual visit. The patient's medical history and problem list were reviewed to inform individualized preventive care recommendations.   No problem-specific medical treatment was provided during this visit.    Assessment & Plan  Assessment and Plan             There are no diagnoses linked to this encounter.  Today's Health Maintenance Counseling and Anticipatory Guidance:  Eye exams:  We encouraged patient to to complete eye exam every 1-2 years.  He reports his last eye exam was:  still doing  Dental health:  He reports he has not been keeping up with his dental visits.  He intends to do so in the future.  We encourage regular tooth brushing/flossing. Sinus health:  We encouraged sterile saline nasal misting sinus rinses daily for pollen, to reduce allergies and risk for sinus infections.   Sterile can based misting products are recommended due to superior misting and ease of maintaining sterility Sleep Apnea screening:  We encourage self monitoring of sleep quality with SnoreLab App and other tools/apps that are now available at retail []  Do you snore loudly []  Tired, fatigued, or sleepy during the daytime []  Witness apneas []  Significant hypertension []  BMI greater than 35    []  Age older than 41 years old [x]  Has large neck size over 15.7 in [x]  Male Total Score:  2 sold  obstructive sleep apnea equipment and will monitor snoring with snore lab Cardiovascular Risk Factor Reduction:   Advised patient of need for regular exercise and diet rich and fruits and vegetables and healthy fats to reduce risk of heart attack and stroke.  Avoid first- and second-hand smoke and stimulants.   Avoid extreme exercise- exercise in moderation (150 minutes per week is a good goal) Discussed health benefits of physical activity, and encouraged him to engage in regular exercise appropriate for his age and condition. Exercise Activities and Dietary recommendations  Goals   None    Wt Readings from Last 3 Encounters:  10/09/23 198 lb 9.6 oz (90.1 kg)  07/28/23 204 lb 9.6 oz (92.8 kg)  06/13/22 199 lb 3.2 oz (90.4 kg)  Body mass index is 30.2 kg/m. Health maintenance and immunizations reviewed and he was encouraged to complete anything that is due: Immunization History  Administered Date(s) Administered   Influenza,inj,Quad PF,6+ Mos 08/11/2018   PPD Test 03/24/2015, 05/29/2015   Tdap 02/02/2021, 01/07/2023   There are no preventive care reminders to display for this patient.  Health Maintenance  Topic Date Due   COVID-19 Vaccine (1 - 2023-24 season) 10/25/2023 (Originally 08/03/2023)   INFLUENZA VACCINE  03/01/2024 (Originally 07/03/2023)   Hepatitis C Screening  07/27/2024 (Originally 07/09/2000)   DTaP/Tdap/Td (3 - Td or Tdap) 01/07/2033   HIV Screening  Completed   HPV VACCINES  Aged Out    Lifestyle / Social History Reviewed:  Social History   Tobacco Use   Smoking  status: Never   Smokeless tobacco: Never  Vaping Use   Vaping status: Never Used  Substance Use Topics   Alcohol use: Yes    Comment: occasional   Drug use: No    My recommendation is total abstinence from all substances of abuse including smoke and 2nd hand smoke, alcohol, illicit drugs, smoking, inhalants, sugar, high risk sexual behavior Offered to assist with any use disorders or addictions.   Sexual  transmitted infection testing offered today, but patient declined as he feels he is low risk based on his sexual history.   Social History   Substance and Sexual Activity  Sexual Activity Yes   Partners: Female  .      10/09/2023    8:39 AM  Depression screen PHQ 2/9  Decreased Interest 0  Down, Depressed, Hopeless 0  PHQ - 2 Score 0   Injury prevention: Discussed safety belts, safety helmets, not texting and driving.  Today's Cancer Screening  Penile/Testicle/Scrotum cancer screening: Asked the patient about genital warts or tumors/abnormalities of penis/testicles/scrotum, encouraged patient to to inform me of any. Patient reports there are none. Thyroid cancer screening: patient advised to check by palpating thyroid for nodules Prostate cancer screening:  Denies family history of prostate cancer or hematospermia so too young for screening by current guidelines.(No results found for: "PSA") Colon cancer screening:  Denies strong family history of colon cancer or blood in stool so no screening is indicated until age 36.   Lung cancer screening:  never smoked Skin cancer screening:  Advised regular sunscreen use. He denies worrisome, changing, or new skin lesions. Showed him pictures of melanomas for reference:   Return to care in 1 year for next preventative visit.      Subjective  42 y.o. male presents today for a complete physical exam.  He reports consuming a general diet. Gym/ health club routine includes cardio, high impact aerobics , jogging on track , and low impact aerobics. He generally feels well. He reports sleeping well. He does not have additional problems to discuss today.  HPI  AI-Extracted: Discussed the use of AI scribe software for clinical note transcription with the patient, who gave verbal consent to proceed.  History of Present Illness          Review of Systems  Constitutional:  Negative for chills, diaphoresis, fever, malaise/fatigue and weight loss.   HENT:  Negative for congestion, ear discharge, ear pain, hearing loss, nosebleeds, sinus pain, sore throat and tinnitus.   Eyes:  Negative for blurred vision, double vision, photophobia, pain, discharge and redness.  Respiratory:  Negative for cough, hemoptysis, sputum production, shortness of breath, wheezing and stridor.   Cardiovascular:  Negative for chest pain, palpitations, orthopnea, claudication, leg swelling and PND.  Gastrointestinal:  Negative for abdominal pain, blood in stool, constipation, diarrhea, heartburn, melena, nausea and vomiting.  Genitourinary:  Negative for dysuria, flank pain, frequency, hematuria and urgency.  Musculoskeletal:  Negative for back pain, falls, joint pain, myalgias and neck pain.  Skin:  Negative for itching and rash.  Neurological:  Negative for dizziness, tingling, tremors, sensory change, speech change, focal weakness, seizures, loss of consciousness, weakness and headaches.  Endo/Heme/Allergies:  Negative for environmental allergies and polydipsia. Does not bruise/bleed easily.  Psychiatric/Behavioral:  Negative for depression, hallucinations, memory loss, substance abuse and suicidal ideas. The patient is not nervous/anxious and does not have insomnia.   Disclaimer about ROS at Annual Preventive Visits Patients are informed before the Review of Systems (ROS) that identifying  significant medical issues during the wellness visit may require immediate attention, potentially resulting in a separate billable encounter beyond the scope of the preventive exam. This disclosure is mandated by professional ethics and legal obligations, as healthcare providers must address any substantial health concerns raised during any patient interaction.  A comprehensive ROS is required by insurance companies for billing the visit. However, this structure may inadvertently discourage patients from fully disclosing health concerns due to potential financial implications.  Consequently, patients often emphasize that any positive ROS findings are related to stable chronic conditions, requesting that these not be discussed during the preventive visit to avoid additional charges. Patients may also ask that reported complaints not be listed in the ROS to prevent affecting billing.  Problem list overviews that were updated at today's visit: No problems updated. I attest that I have reviewed and confirmed the patients current medications to meet the medication reconciliation requirement  No current outpatient medications on file prior to visit.   No current facility-administered medications on file prior to visit.  There are no discontinued medications. No outpatient medications prior to visit.   No facility-administered medications prior to visit.  Review of Systems  Constitutional:  Negative for chills, diaphoresis, fever, malaise/fatigue and weight loss.  HENT:  Negative for congestion, ear discharge, ear pain, hearing loss, nosebleeds, sinus pain, sore throat and tinnitus.   Eyes:  Negative for blurred vision, double vision, photophobia, pain, discharge and redness.  Respiratory:  Negative for cough, hemoptysis, sputum production, shortness of breath, wheezing and stridor.   Cardiovascular:  Negative for chest pain, palpitations, orthopnea, claudication, leg swelling and PND.  Gastrointestinal:  Negative for abdominal pain, blood in stool, constipation, diarrhea, heartburn, melena, nausea and vomiting.  Endocrine: Negative for polydipsia.  Genitourinary:  Negative for dysuria, flank pain, frequency, hematuria and urgency.  Musculoskeletal:  Negative for back pain, falls, joint pain, myalgias and neck pain.  Skin:  Negative for itching and rash.  Allergic/Immunologic: Negative for environmental allergies.  Neurological:  Negative for dizziness, tingling, tremors, sensory change, speech change, focal weakness, seizures, loss of consciousness, weakness and  headaches.  Hematological:  Does not bruise/bleed easily.  Psychiatric/Behavioral:  Negative for depression, hallucinations, memory loss, substance abuse and suicidal ideas. The patient is not nervous/anxious and does not have insomnia.   The following were reviewed and entered/updated into our electronic MEDICAL RECORD NUMBERPast Medical History:  Diagnosis Date   DDD (degenerative disc disease), cervical 07/28/2023   Past Surgical History:  Procedure Laterality Date   LAPAROSCOPIC APPENDECTOMY N/A 07/19/2017   Procedure: APPENDECTOMY LAPAROSCOPIC;  Surgeon: Berna Bue, MD;  Location: [facility] OR;  Service: General;  Laterality: N/A;   VASECTOMY     Social History   Socioeconomic History   Marital status: Married    Spouse name: Not on file   Number of children: Not on file   Years of education: Not on file   Highest education level: Not on file  Occupational History   Not on file  Tobacco Use   Smoking status: Never   Smokeless tobacco: Never  Vaping Use   Vaping status: Never Used  Substance and Sexual Activity   Alcohol use: Yes    Comment: occasional   Drug use: No   Sexual activity: Yes    Partners: Female  Other Topics Concern   Not on file  Social History Narrative   Not on file   Social Determinants of Health   Financial Resource Strain: Not on file  Food  Insecurity: Not on file  Transportation Needs: Not on file  Physical Activity: Not on file  Stress: Not on file  Social Connections: Unknown (04/16/2022)   Received from [facility]   Social Network    Social Network: Not on file  Intimate Partner Violence: Unknown (03/08/2022)   [facility], [facility]   HITS    Physically Hurt: Not on file    Insult or Talk Down To: Not on file    Threaten Physical Harm: Not on file    Scream or Curse: Not on file   Family Status  Relation Name Status   Mother  Alive   Father  Alive   Brother  Alive   MGM  Deceased   MGF  Alive    PGM  Deceased   PGF  Deceased  No partnership data on file   Family History  Problem Relation Age of Onset   Stroke Father    Hyperlipidemia Father    Cancer Maternal Grandmother    Hypertension Maternal Grandfather   No Known Allergies Patient Care Team: Lula Olszewski, MD as PCP - General (Internal Medicine)      Objective  BP 130/78 (BP Location: Left Arm, Patient Position: Sitting)   Pulse 76   Temp (!) 97.4 F (36.3 C) (Temporal)   Ht 5\' 8"  (1.727 m)   Wt 198 lb 9.6 oz (90.1 kg)   SpO2 98%   BMI 30.20 kg/m  BP Readings from Last 3 Encounters:  10/09/23 130/78  07/28/23 138/82  06/13/22 110/80      Physical Exam  Wt Readings from Last 3 Encounters:  10/09/23 198 lb 9.6 oz (90.1 kg)  07/28/23 204 lb 9.6 oz (92.8 kg)  06/13/22 199 lb 3.2 oz (90.4 kg)  Physical Exam         GEN: NAD, Resting Comfortably. HEENT: Tympanic membranes normal appearing bilaterally, Oropharynx clear, No Thyromegaly noted. No palpable lymphadenopathy or thyroid nodules. CARDIOVASCULAR: S1 and S2 heart sounds have regular rate and rhythm with no murmurs appreciated. PULMONARY:  Normal work of breathing. Clear to auscultation bilaterally with no crackles, wheezes, or rhonchi. ABDOMEN: Soft, Nontender, Nondistended.  MSK: No edema, cyanosis, or clubbing noted. SKIN: Warm, dry, no lesions of concern observed. NEURO: CN2-12 grossly intact. Strength 5/5 in upper and lower extremities. Reflexes symmetric and intact bilaterally.  PSYCH: Normal affect and thought content, pleasant and cooperative. Last depression screening scores    10/09/2023    8:39 AM 07/28/2023   10:09 AM 08/11/2018    2:19 PM  PHQ 2/9 Scores  PHQ - 2 Score 0 0 0  PHQ- 9 Score   0   Last fall risk screening    10/09/2023    8:38 AM  Fall Risk   Falls in the past year? 0  Number falls in past yr: 0  Injury with Fall? 0  Risk for fall due to : No Fall Risks  Follow up Falls evaluation completed   Last Audit-C  alcohol use screening     No data to display         A score of 3 or more in women, and 4 or more in men indicates increased risk for alcohol abuse, EXCEPT if all of the points are from question 1  Last CBC Lab Results  Component Value Date   WBC 15.5 (H) 07/19/2017   HGB 16.4 07/19/2017   HCT 46.2 07/19/2017   MCV 84.5 07/19/2017   MCH  30.0 07/19/2017   RDW 12.5 07/19/2017   PLT 213 07/19/2017   Last metabolic panel Lab Results  Component Value Date   GLUCOSE 86 08/11/2018   NA 139 08/11/2018   K 3.8 08/11/2018   CL 104 08/11/2018   CO2 28 08/11/2018   BUN 19 08/11/2018   CREATININE 1.04 08/11/2018   GFR 85.84 08/11/2018   CALCIUM 9.5 08/11/2018   PROT 7.1 08/11/2018   ALBUMIN 4.6 08/11/2018   BILITOT 0.9 08/11/2018   ALKPHOS 58 08/11/2018   AST 18 08/11/2018   ALT 18 08/11/2018   ANIONGAP 10 07/19/2017   Last lipids Lab Results  Component Value Date   CHOL 157 08/11/2018   HDL 45.50 08/11/2018   LDLCALC 87 08/11/2018   TRIG 120.0 08/11/2018   CHOLHDL 3 08/11/2018   Last hemoglobin A1c No results found for: "HGBA1C" Last thyroid functions No results found for: "TSH", "T3TOTAL", "T4TOTAL", "THYROIDAB" Last vitamin D No results found for: "25OHVITD2", "25OHVITD3", "VD25OH" Last vitamin B12 and Folate No results found for: "[id number]", "FOLATE"      Lula Olszewski, MD  [facility] [phone] (phone) [phone] (fax) [facility]

## 2023-10-09 NOTE — Patient Instructions (Signed)
VISIT SUMMARY:  You came in today for a routine physical examination, similar to routine maintenance for a car. You reported no specific health concerns or symptoms. We discussed your family history of Parkinson's disease, Alzheimer's disease, and ovarian cancer, but you have no personal history of these conditions. You also mentioned regular physical activity and the need for an eye examination.  YOUR PLAN:  -SKIN: There are no signs of infection despite prolonged use of razor blades, and while you have some moles, they are not concerning at this time. Please monitor your moles for any changes and consider taking pictures for comparison.  -NECK: Your lymph nodes are at the upper limit of normal with no signs of thyroid cancer or enlargement. We will continue to monitor your lymph nodes at yearly check-ins, and there is no need for ultrasounds at this time.  -THROAT: A small airway increases your risk for sleep apnea, though there are no signs of tonsillar enlargement. Please monitor for signs of sleep apnea, especially if you gain weight, and consider using the Snorelab app for monitoring.  -EYES: You have not had a recent eye exam. We recommend getting an eye exam every two years for retinal imaging and glaucoma screening.  -PHYSICAL ACTIVITY: You engage in regular physical activity through work and occasional gym visits. We encourage you to continue staying active.  -LAB WORK: It has been several years since your last comprehensive lab work. We will order lab tests, including thyroid testing, due to central adiposity.  INSTRUCTIONS:  Please return tomorrow for lab work. We will schedule your next annual wellness visit for one year from now.Please try these tips to maintain a healthy lifestyle:  Avoid processed foods like bologna, salami, spam, candy bars  Try to eat non-starchy and fiber-rich vegetables, 30%-plus lean proteins, and only healthy fats (nuts, extra virgin olive oil, fatty  fish) or lean meats.  Try to completely eliminate sugar containing beverages. This includes juice, non diet soda, and sweet tea.   Drink at least 1 glass of water with each meal and aim for at least 8 glasses per day  Eat a diet rich and fruits and vegetables and healthy fats (plant and fish fats) to reduce risk of heart attack and stroke.  In particular, avocado, extra virgin olive oil, nuts, and fatty fish are known to be great for your blood vessels.  Exercise at least 150 minutes every week.  Try to do all 3 types of exercise: 1)  Stretching (yoga-type), 2) cardio  and 3) resistance training exercises for muscle building.  Try to avoid exercise with a lot of impact (running on concrete is hard on joints) and extreme exercise (marathons) which have been shown to do more harm than good.   Keep challenging yourself to make positive and sustainable lifestyle changes. Love and be good to your future self!  You can't hate yourself into positive changes.  If you know you have an unhealthy coping strategy (e.g. substance or food misuse) or if depression/anxiety is holding you back, please let me know;  I am trained in addiction treatment and passionate about helping people to make positive life changes!  Get good sleep (8 hours on a consistent schedule.  Sleep is important for mood management, weight management, and cognitive performance. Snoring and sleep apnea are connected to alzheimer's dementia, depression, obesity, and irritability  For your mental status:  practice gratitude exercises daily, focus on self-growth and creative interests, and use mindfulness to stay out of negative  emotional states.

## 2023-10-10 ENCOUNTER — Other Ambulatory Visit (INDEPENDENT_AMBULATORY_CARE_PROVIDER_SITE_OTHER): Payer: Managed Care, Other (non HMO)

## 2023-10-10 DIAGNOSIS — Z0000 Encounter for general adult medical examination without abnormal findings: Secondary | ICD-10-CM | POA: Diagnosis not present

## 2023-10-10 DIAGNOSIS — Z0001 Encounter for general adult medical examination with abnormal findings: Secondary | ICD-10-CM | POA: Diagnosis not present

## 2023-10-10 LAB — CBC WITH DIFFERENTIAL/PLATELET
Basophils Absolute: 0 10*3/uL (ref 0.0–0.1)
Basophils Relative: 0.7 % (ref 0.0–3.0)
Eosinophils Absolute: 0.3 10*3/uL (ref 0.0–0.7)
Eosinophils Relative: 4.6 % (ref 0.0–5.0)
HCT: 44.4 % (ref 39.0–52.0)
Hemoglobin: 15.4 g/dL (ref 13.0–17.0)
Lymphocytes Relative: 32.4 % (ref 12.0–46.0)
Lymphs Abs: 1.8 10*3/uL (ref 0.7–4.0)
MCHC: 34.6 g/dL (ref 30.0–36.0)
MCV: 87.1 fL (ref 78.0–100.0)
Monocytes Absolute: 0.4 10*3/uL (ref 0.1–1.0)
Monocytes Relative: 6.6 % (ref 3.0–12.0)
Neutro Abs: 3.1 10*3/uL (ref 1.4–7.7)
Neutrophils Relative %: 55.7 % (ref 43.0–77.0)
Platelets: 243 10*3/uL (ref 150.0–400.0)
RBC: 5.1 Mil/uL (ref 4.22–5.81)
RDW: 13.2 % (ref 11.5–15.5)
WBC: 5.5 10*3/uL (ref 4.0–10.5)

## 2023-10-10 LAB — COMPREHENSIVE METABOLIC PANEL
ALT: 19 U/L (ref 0–53)
AST: 20 U/L (ref 0–37)
Albumin: 4.5 g/dL (ref 3.5–5.2)
Alkaline Phosphatase: 63 U/L (ref 39–117)
BUN: 14 mg/dL (ref 6–23)
CO2: 28 meq/L (ref 19–32)
Calcium: 9.2 mg/dL (ref 8.4–10.5)
Chloride: 103 meq/L (ref 96–112)
Creatinine, Ser: 0.96 mg/dL (ref 0.40–1.50)
GFR: 98.36 mL/min (ref >60.00)
Glucose, Bld: 108 mg/dL — ABNORMAL HIGH (ref 70–99)
Potassium: 4 meq/L (ref 3.5–5.1)
Sodium: 137 meq/L (ref 135–145)
Total Bilirubin: 0.6 mg/dL (ref 0.2–1.2)
Total Protein: 7.1 g/dL (ref 6.0–8.3)

## 2023-10-10 LAB — LIPID PANEL
Cholesterol: 179 mg/dL (ref 0–200)
HDL: 52.5 mg/dL (ref >39.00)
LDL Cholesterol: 109 mg/dL — ABNORMAL HIGH (ref 0–99)
NonHDL: 126.86
Total CHOL/HDL Ratio: 3
Triglycerides: 89 mg/dL (ref 0.0–149.0)
VLDL: 17.8 mg/dL (ref 0.0–40.0)

## 2023-10-10 LAB — TSH: TSH: 2.63 u[IU]/mL (ref 0.35–5.50)

## 2023-10-12 ENCOUNTER — Encounter: Payer: Self-pay | Admitting: Internal Medicine

## 2023-10-12 ENCOUNTER — Other Ambulatory Visit: Payer: Self-pay | Admitting: Internal Medicine

## 2023-10-12 DIAGNOSIS — L989 Disorder of the skin and subcutaneous tissue, unspecified: Secondary | ICD-10-CM

## 2023-10-12 NOTE — Progress Notes (Signed)
Refer to dermatology for skin lesion.  Reviewed annual labs from 10/10/2023. HDL improved to 52.50 (previously 37.90). LDL slightly elevated at 109. Blood sugar mildly elevated at 108. CBC, thyroid, and other metabolic results normal. BMI improved to 30.2. Previous cough resolved. Occupational exposures well-controlled with PPE. Skin lesion stable. Detailed explanation and recommendations provided in Patient Message. Next appointment 10/12/2024.

## 2023-10-13 NOTE — Telephone Encounter (Signed)
Patient's review of lab results/notes letter confirmed.

## 2024-06-14 ENCOUNTER — Encounter: Payer: Self-pay | Admitting: Internal Medicine

## 2024-06-25 ENCOUNTER — Ambulatory Visit (INDEPENDENT_AMBULATORY_CARE_PROVIDER_SITE_OTHER): Admitting: Internal Medicine

## 2024-06-25 ENCOUNTER — Encounter: Payer: Self-pay | Admitting: Internal Medicine

## 2024-06-25 ENCOUNTER — Other Ambulatory Visit: Payer: Self-pay | Admitting: Internal Medicine

## 2024-06-25 VITALS — BP 120/82 | HR 71 | Temp 98.0°F | Resp 98 | Ht 68.0 in | Wt 197.8 lb

## 2024-06-25 DIAGNOSIS — L84 Corns and callosities: Secondary | ICD-10-CM

## 2024-06-25 DIAGNOSIS — M6020 Foreign body granuloma of soft tissue, not elsewhere classified, unspecified site: Secondary | ICD-10-CM | POA: Diagnosis not present

## 2024-06-25 DIAGNOSIS — L03119 Cellulitis of unspecified part of limb: Secondary | ICD-10-CM | POA: Diagnosis not present

## 2024-06-25 DIAGNOSIS — L989 Disorder of the skin and subcutaneous tissue, unspecified: Secondary | ICD-10-CM | POA: Diagnosis not present

## 2024-06-25 MED ORDER — DOXYCYCLINE HYCLATE 100 MG PO TABS: 100.0000 mg | ORAL_TABLET | Freq: Two times a day (BID) | ORAL | 0 refills | Status: AC

## 2024-06-25 MED ORDER — UREA 35 % EX FOAM: CUTANEOUS | 0 refills | Status: DC

## 2024-06-25 NOTE — Progress Notes (Signed)
 ==============================  [facility]: [id number]   -- Medical Office Visit --  Patient: Christopher Hoffman      Age: 43 y.o.       Sex:  male  Date:   06/25/2024 Today's Healthcare Provider: Bernardino KANDICE Cone, MD  ==============================   Chief Complaint: foot problems (Has two spots on the left foot came up about mth ago has gotten a piece out of one one spot is painful as well.)  Discussed the use of AI scribe software for clinical note transcription with the patient, who gave verbal consent to proceed.  History of Present Illness Christopher Hoffman is a 43 year old male who presents with a painful lesion on his left foot.  He has a painful lesion on the anterior plantar heel, medial side of his left foot, resembling a splinter, present for a couple of weeks to a month or two. He initially thought it was a splinter due to past incidents of glass breaking at home.  He attempted self-treatment by cutting open the area and applying pressure, which resulted in the expulsion of a pustule-like mass, similar in appearance to a mustard or poppy seed. This provided relief for one spot, but another area remains tender, particularly when pressure is applied. His feet are very calloused, requiring significant cutting to reach the affected area.  He keeps antibiotics on hand for potential infections but has not used them for this issue. The lesion is only painful when barefoot and stepping on uneven surfaces; it is not bothersome when wearing shoes or sandals.  He has a history of attempting to remove the lesion himself multiple times, resulting in some fibrosis and scarring. He is uncertain if there is a foreign body present but suspects it could be encapsulated by the body. He has not had any recent clear memory of acquiring a splinter.  He reports tenderness in the affected area, particularly when pressure is applied.  Background  Reviewed: Problem List: has Dyslipidemia (high LDL; low HDL); Skin lesion; Chronic cough; Weight disorder; and Occupational exposure in workplace on their problem list. Past Medical History:  has a past medical history of DDD (degenerative disc disease), cervical (07/28/2023). Past Surgical History:   has a past surgical history that includes Vasectomy and laparoscopic appendectomy (N/A, 07/19/2017). Social History:   reports that he has never smoked. He has never used smokeless tobacco. He reports current alcohol use. He reports that he does not use drugs. Family History:  family history includes Cancer in his maternal grandmother; Hyperlipidemia in his father; Hypertension in his maternal grandfather; Stroke in his father. Allergies:  has no known allergies.   Medication Reconciliation: No current outpatient medications on file prior to visit.   No current facility-administered medications on file prior to visit.  There are no discontinued medications.   Physical Exam:    06/25/2024    3:43 PM 10/09/2023    8:36 AM 07/28/2023   10:11 AM  Vitals with BMI  Height 5' 8 5' 8   Weight 197 lbs 13 oz 198 lbs 10 oz   BMI 30.08 30.2   Systolic 120 130 861  Diastolic 82 78 82  Pulse 71 76   Vital signs reviewed.  Nursing notes reviewed. Weight trend reviewed. Physical Exam General Appearance:  No acute distress appreciable.   Well-groomed, healthy-appearing male.  Well proportioned with no abnormal fat distribution.  Good muscle tone. Pulmonary:  Normal work of breathing at rest, no  respiratory distress apparent.    Musculoskeletal: All extremities are intact.  Neurological:  Awake, alert, oriented, and engaged.  No obvious focal neurological deficits or cognitive impairments.  Sensorium seems unclouded.   Speech is clear and coherent with logical content. Psychiatric:  Appropriate mood, pleasant and cooperative demeanor, thoughtful and engaged during the exam Physical Exam SKIN: No  secondary infection on foot. Tenderness at the five o'clock position of the erythematous area on foot. Photographs Taken 06/26/2024 :     Chunk of tissue on rubbing alcohol dropped off at lab.  Results:    10/09/2023    8:39 AM 07/28/2023   10:09 AM 08/11/2018    2:19 PM 03/28/2017   10:06 AM  PHQ 2/9 Scores  PHQ - 2 Score 0 0 0 0  PHQ- 9 Score   0    Results Procedure: Dermoscopy   Description: The dermatoscope was applied to the left foot. Observation revealed a cluster of petechiae or telangiectasia without a foreign body.    No results found for any visits on 06/25/24. Lab on 10/10/2023  Component Date Value Ref Range Status   WBC 10/10/2023 5.5  4.0 - 10.5 K/uL Final   RBC 10/10/2023 5.10  4.22 - 5.81 Mil/uL Final   Hemoglobin 10/10/2023 15.4  13.0 - 17.0 g/dL Final   HCT 88/91/7975 44.4  39.0 - 52.0 % Final   MCV 10/10/2023 87.1  78.0 - 100.0 fl Final   MCHC 10/10/2023 34.6  30.0 - 36.0 g/dL Final   RDW 88/91/7975 13.2  11.5 - 15.5 % Final   Platelets 10/10/2023 243.0  150.0 - 400.0 K/uL Final   Neutrophils Relative % 10/10/2023 55.7  43.0 - 77.0 % Final   Lymphocytes Relative 10/10/2023 32.4  12.0 - 46.0 % Final   Monocytes Relative 10/10/2023 6.6  3.0 - 12.0 % Final   Eosinophils Relative 10/10/2023 4.6  0.0 - 5.0 % Final   Basophils Relative 10/10/2023 0.7  0.0 - 3.0 % Final   Neutro Abs 10/10/2023 3.1  1.4 - 7.7 K/uL Final   Lymphs Abs 10/10/2023 1.8  0.7 - 4.0 K/uL Final   Monocytes Absolute 10/10/2023 0.4  0.1 - 1.0 K/uL Final   Eosinophils Absolute 10/10/2023 0.3  0.0 - 0.7 K/uL Final   Basophils Absolute 10/10/2023 0.0  0.0 - 0.1 K/uL Final   Sodium 10/10/2023 137  135 - 145 mEq/L Final   Potassium 10/10/2023 4.0  3.5 - 5.1 mEq/L Final   Chloride 10/10/2023 103  96 - 112 mEq/L Final   CO2 10/10/2023 28  19 - 32 mEq/L Final   Glucose, Bld 10/10/2023 108 (H)  70 - 99 mg/dL Final   BUN 88/91/7975 14  6 - 23 mg/dL Final   Creatinine, Ser 10/10/2023 0.96  0.40 -  1.50 mg/dL Final   Total Bilirubin 10/10/2023 0.6  0.2 - 1.2 mg/dL Final   Alkaline Phosphatase 10/10/2023 63  39 - 117 U/L Final   AST 10/10/2023 20  0 - 37 U/L Final   ALT 10/10/2023 19  0 - 53 U/L Final   Total Protein 10/10/2023 7.1  6.0 - 8.3 g/dL Final   Albumin 88/91/7975 4.5  3.5 - 5.2 g/dL Final   GFR 88/91/7975 98.36  >60.00 mL/min Final   Calcium 10/10/2023 9.2  8.4 - 10.5 mg/dL Final   Cholesterol 88/91/7975 179  0 - 200 mg/dL Final   Triglycerides 88/91/7975 89.0  0.0 - 149.0 mg/dL Final   HDL 88/91/7975 52.50  >39.00  mg/dL Final   VLDL 88/91/7975 17.8  0.0 - 40.0 mg/dL Final   LDL Cholesterol 10/10/2023 109 (H)  0 - 99 mg/dL Final   Total CHOL/HDL Ratio 10/10/2023 3   Final   NonHDL 10/10/2023 126.86   Final   TSH 10/10/2023 2.63  0.35 - 5.50 uIU/mL Final  No image results found. No results found.       ASSESSMENT & PLAN   Assessment & Plan Skin lesion Foreign body reaction Cellulitis of lower extremity, unspecified laterality Callus of foot He presents with a foreign body reaction in the left anterior plantar heel, medial area, persisting for weeks to months. He self-extracted a mass, which provided relief, but tenderness remains, suggesting a possible dermatofibroma due to a foreign body. There are no signs of infection, but tenderness indicates a potential deep wound. Encapsulation and fibrosis due to a foreign body were discussed. Advised against further self-extraction to prevent damage and recommended urea  cream to soften the skin and facilitate natural expulsion. Dermoscopy showed no removable foreign body, so surgery was not pursued. Emphasized avoiding further cutting and using chemical softeners. If no improvement, refer to a foot specialist. Send the extracted mass for biopsy to [facility] or [facility]. Prescribe urea  cream for topical application to soften the skin and facilitate foreign body expulsion. Provide antibiotics for potential infection if symptoms worsen.  Consider referral to a foot specialist if the condition does not improve with current treatment.   ORDER ASSOCIATIONS  #   DIAGNOSIS / CONDITION ICD-10 ENCOUNTER ORDER     ICD-10-CM   1. Skin lesion  L98.9 Surgical pathology    Anatomic Pathology Report    Pathology    doxycycline  (VIBRA -TABS) 100 MG tablet    2. Cellulitis of lower extremity, unspecified laterality  L03.119 doxycycline  (VIBRA -TABS) 100 MG tablet    3. Callus of foot  L84 DISCONTINUED: Urea  35 % FOAM    4. Foreign body reaction  M60.20       Meds ordered this encounter  Medications   doxycycline  (VIBRA -TABS) 100 MG tablet    Sig: Take 1 tablet (100 mg total) by mouth 2 (two) times daily.    Dispense:  20 tablet    Refill:  0   DISCONTD: Urea  35 % FOAM    Sig: **Apply urea  35% foam to the affected area of the foot once daily. Wash and dry the feet thoroughly before application. Apply a thin layer of foam to the callused area, gently massaging it in until absorbed. Avoid application to broken or irritated skin. Wash hands after use. For external use only.** Optional Additional Instructions: For best results, use consistently as directed. If irritation occurs, discontinue use and notify your healthcare provider. May be used in conjunction with a pumice stone or gentle filing after several days of use, if recommended.    Dispense:  227 g    Refill:  0    ED Discharge Orders          Ordered    Surgical pathology       Comments: [facility]    06/25/24 1624    Anatomic Pathology Report       Comments: Left foot anterior plantar medial heel    06/25/24 1628    Pathology       Comments: Left foot anterior plantar medial heel    06/25/24 1630    doxycycline  (VIBRA -TABS) 100 MG tablet  2 times daily        06/25/24 1637  Urea  35 % FOAM  Status:  Discontinued        06/25/24 1654              This document was synthesized by artificial intelligence ([employer]) using HIPAA-compliant recording of the clinical  interaction;   We discussed the use of AI scribe software for clinical note transcription with the patient, who gave verbal consent to proceed. additional Info: This encounter employed state-of-the-art, real-time, collaborative documentation. The patient actively reviewed and assisted in updating their electronic medical record on a shared screen, ensuring transparency and facilitating joint problem-solving for the problem list, overview, and plan. This approach promotes accurate, informed care. The treatment plan was discussed and reviewed in detail, including medication safety, potential side effects, and all patient questions. We confirmed understanding and comfort with the plan. Follow-up instructions were established, including contacting the office for any concerns, returning if symptoms worsen, persist, or new symptoms develop, and precautions for potential emergency department visits.

## 2024-06-26 NOTE — Assessment & Plan Note (Signed)
 He presents with a foreign body reaction in the left anterior plantar heel, medial area, persisting for weeks to months. He self-extracted a mass, which provided relief, but tenderness remains, suggesting a possible dermatofibroma due to a foreign body. There are no signs of infection, but tenderness indicates a potential deep wound. Encapsulation and fibrosis due to a foreign body were discussed. Advised against further self-extraction to prevent damage and recommended urea  cream to soften the skin and facilitate natural expulsion. Dermoscopy showed no removable foreign body, so surgery was not pursued. Emphasized avoiding further cutting and using chemical softeners. If no improvement, refer to a foot specialist. Send the extracted mass for biopsy to [facility] or [facility]. Prescribe urea  cream for topical application to soften the skin and facilitate foreign body expulsion. Provide antibiotics for potential infection if symptoms worsen. Consider referral to a foot specialist if the condition does not improve with current treatment.

## 2024-07-07 ENCOUNTER — Other Ambulatory Visit: Payer: Self-pay

## 2024-07-07 DIAGNOSIS — L989 Disorder of the skin and subcutaneous tissue, unspecified: Secondary | ICD-10-CM

## 2024-07-07 NOTE — Telephone Encounter (Signed)
 Sent message to lab waiting on reply

## 2024-07-07 NOTE — Telephone Encounter (Signed)
 Waiting on the lab to see if we have the right tubes to sent off spoke with penny she is going to call her boss jill and go from there

## 2024-07-08 ENCOUNTER — Other Ambulatory Visit: Payer: Self-pay

## 2024-07-08 ENCOUNTER — Telehealth: Payer: Self-pay

## 2024-07-08 ENCOUNTER — Other Ambulatory Visit (HOSPITAL_COMMUNITY)
Admission: RE | Admit: 2024-07-08 | Discharge: 2024-07-08 | Disposition: A | Discharge status: Home / Self Care | Source: Ambulatory Visit | Attending: Internal Medicine | Admitting: Internal Medicine

## 2024-07-08 DIAGNOSIS — L989 Disorder of the skin and subcutaneous tissue, unspecified: Secondary | ICD-10-CM | POA: Diagnosis present

## 2024-07-08 NOTE — Telephone Encounter (Signed)
 Copied from CRM [id number]. Topic: General - Other >> Jul 08, 2024 11:45 AM Robinson H wrote: Reason for CRM: Etta calling regarding order Dr. Jesus submitted for skin sample removed yesterday, states order is keyed as future and she can't access order.  Etta [phone]  Submitted new order

## 2024-07-12 LAB — SURGICAL PATHOLOGY

## 2024-07-13 ENCOUNTER — Ambulatory Visit: Payer: Self-pay | Admitting: Internal Medicine

## 2024-10-12 ENCOUNTER — Encounter: Payer: Self-pay | Admitting: Internal Medicine

## 2024-10-12 ENCOUNTER — Ambulatory Visit: Payer: Managed Care, Other (non HMO) | Admitting: Internal Medicine

## 2024-10-12 VITALS — BP 120/82 | HR 83 | Temp 98.0°F | Ht 68.0 in | Wt 203.6 lb

## 2024-10-12 DIAGNOSIS — Z0001 Encounter for general adult medical examination with abnormal findings: Secondary | ICD-10-CM

## 2024-10-12 DIAGNOSIS — Z0000 Encounter for general adult medical examination without abnormal findings: Secondary | ICD-10-CM | POA: Diagnosis not present

## 2024-10-12 NOTE — Patient Instructions (Addendum)
 Photographs Taken 10/12/2024 :    This lesion is 4 mm in largest dimension and looks like a benign nevus to me but I encourage you to keep a close eye on it this year and if it changes at all in size or shape let me know immediately   VISIT SUMMARY: Today, you came in for your annual physical exam. We discussed your general health, including your high stress levels, dietary habits, sleep patterns, and alcohol consumption. You expressed interest in understanding your metabolic health and potential dietary changes. We also reviewed your family history of cognitive decline and discussed various health screenings, including cardiovascular and cancer screenings.  YOUR PLAN: -GENERAL HEALTH AND WELLNESS: You are experiencing high stress, poor sleep, and a suboptimal diet. We discussed following a Mediterranean diet with extra virgin olive oil, fish, and limited red meat, and considering a modified keto Mediterranean diet. Avoid trans fats and maintain a balanced diet. For exercise, aim for moderate intensity interval training (MIT) and zone 2 exercise. We also talked about the Cologuard test for colon cancer screening, which is a non-invasive option with high accuracy. Additionally, you are encouraged to look into self-pay lab promotions for an extensive metabolic panel and glucose testing.  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 We discussed the potential for removal and the risk of infection. We will consider removal if it becomes problematic.  INSTRUCTIONS: Please follow the Mediterranean diet guidelines we discussed and incorporate moderate intensity interval training (MIT) and zone 2 exercise into your routine. Consider the Cologuard test for colon cancer screening and look into self-pay lab promotions for an extensive metabolic panel and glucose testing. If the lesion on your left foot becomes more problematic, please schedule a follow-up appointment for  further evaluation.  Building Your Long-Term Health Plan  During today's preventive visit, we covered a variety of important health checks to help you stay on top of your well-being.  We also discussed strategies to maintain your health and identified some areas that might benefit from further exploration.   Preventive care visits like today's are designed to be proactive, but sometimes additional attention may be needed.  Rest assured, we're here for you.  If these areas require further evaluation or management, we'd be happy to schedule a separate, focused appointment to address them in detail.  Addressing Next Steps  [x]   Follow-up Visit: To ensure we address any unresolved issues and continue monitoring your overall health, we recommend scheduling a follow-up appointment in 1 year for your next preventive care visit. If you experience any new problems, need to discuss any medical concerns, or your condition worsens before then, please don't hesitate to call our office to schedule an appointment or seek emergency care as needed.  [x]   Preventive Measures: Maintaining healthy habits plays a crucial role in overall wellness. We recommend considering these tips: [x]   Regular appointments with dental and vision professionals [x]   Nightly nasal saline mist to keep sinuses clear [x]   Consistent toothbrushing to maintain oral health [x]   Using an app like SnoreLab to track sleep quality [x]   Routine checks of blood pressure and heart rate [x]   Medical Information: In some instances, we may require additional medical information from other providers to create a comprehensive picture of your health. If applicable, we can provide a medical information release form at the front desk for you to sign, allowing us  to gather these records. [x]   Lab Tests: If any  lab tests were ordered today, scheduling them within a week of your visit helps ensure the best possible insurance coverage.  Planning Follow Up to  Work on a Problem? Make the Most of Our Focused (20 minute) Appointments  [x]   Clearly state your top concerns at the beginning of the visit to focus our discussion [x]   If you anticipate you will need more time, please inform the front desk during scheduling - we can book multiple appointments in the same week. [x]   If you have transportation problems- use our convenient video appointments or ask about transportation support. [x]   We can get down to business faster if you use MyChart to update information before the visit and submit non-urgent questions before your visit. Thank you for taking the time to provide details through MyChart.  Let our nurse know and she can import this information into your encounter documents.  Arrival and Wait Times  [x]   Arriving on time ensures that everyone receives prompt attention. [x]   Early morning (8a) and afternoon (1p) appointments tend to have shortest wait times. [x]   Unfortunately, we cannot delay appointments for late arrivals or hold slots during phone calls.  Bring to Your Next Appointment:  [x]   Medications: Please bring all your medication bottles to your next appointment to ensure we have an accurate record of your prescriptions. [x]   Health Diaries: If you're monitoring any health conditions at home, keeping a diary of your readings can be very helpful for discussions at your next appointment.  Reviewing Your Records  [x]   Review your attached preventive care information at the end of these patient instructions. [x]   Review this early draft of your clinical encounter notes below and the final encounter summary tomorrow on MyChart after its been completed.      Getting Answers and Following Up  [x]   Simple Questions & Concerns: For quick questions or basic follow-up after your visit, reach us  at [phone] or MyChart messaging. [x]   Complex Concerns: If your concern is more complex, scheduling an appointment might be best. Discuss this  with the staff to find the most suitable option. [x]   Lab & Imaging Results: We'll contact you directly if results are abnormal or you don't use MyChart. Most normal results will be on MyChart within 2-3 business days, with a review message from Dr. Jesus. Haven't heard back in 2 weeks? Need results sooner? Contact us  at [phone]. [x]   Referrals: Our referral coordinator will manage specialist referrals. The specialist's office should contact you within 2 weeks to schedule an appointment. Call us  if you haven't heard from them after 2 weeks.  Staying Connected  [x]   MyChart: Activate your MyChart for the fastest way to access results and message us . See the last page of this paperwork for instructions on how to activate.  Billing  [x]   X-ray & Lab Orders: These are billed by separate companies. Contact the invoicing company directly for questions or concerns. [x]   Visit Charges: Discuss any billing inquiries with our administrative services team.  Your Satisfaction Matters  [x]   Share Your Experience: We strive for your satisfaction! If you have any complaints, or preferably compliments, please let Dr. Jesus know directly or contact our Practice Administrators, Manuelita Rubin or [employer], by asking at the front desk.                 Next Steps  [x]   Schedule Follow-Up:  We recommend a follow-up appointment in 1 year for your next  wellness visit.  If you develop any new problems, want to address any medical issues, or your condition worsens before then, please call us  for an appointment or seek emergency care. [x]   Preventive Care:  Make sure to keep regular appointments with dental and vision professionals, use nightly nasal saline mist sprays to keep your sinuses clear and toothbrushing to protect your teeth. Use SnoreLab App or other app to track your sleep quality. Check blood pressure and heart rate routinely. [x]   Medical Information Release:  For any relevant  medical information we don't have, please sign a release form at the front desk so we can obtain it for your records. [x]   Lab Tests:  Schedule any lab tests from today for within a week to ensure best insurance coverage.    Making the Most of Our Focused (20 minute) Appointments:  [x]   Clearly state your top concerns at the beginning of the visit to focus our discussion [x]   If you anticipate you will need more time, please inform the front desk during scheduling - we can book multiple appointments in the same week. [x]   If you have transportation problems- use our convenient video appointments or ask about transportation support. [x]   We can get down to business faster if you use MyChart to update information before the visit and submit non-urgent questions before your visit. Thank you for taking the time to provide details through MyChart.  Let our nurse know and she can import this information into your encounter documents.  Arrival and Wait Times: [x]   Arriving on time ensures that everyone receives prompt attention. [x]   Early morning (8a) and afternoon (1p) appointments tend to have shortest wait times. [x]   Unfortunately, we cannot delay appointments for late arrivals or hold slots during phone calls.  Bring to Your Next Appointment  [x]   Medications: Please bring all your medication bottles to your next appointment to ensure we have an accurate record of your prescriptions. [x]   Health Diaries: If you're monitoring any health conditions at home, keeping a diary of your readings can be very helpful for discussions at your next appointment.  Reviewing Your Records  [x]   Review your attached preventive care information at the end of these patient instructions. [x]   Review this early draft of your clinical encounter notes below and the final encounter summary tomorrow on MyChart after its been completed.   Encounter for annual general medical examination with abnormal findings in  adult     Getting Answers and Following Up  [x]   Simple Questions & Concerns: For quick questions or basic follow-up after your visit, reach us  at [phone] or MyChart messaging. [x]   Complex Concerns: If your concern is more complex, scheduling an appointment might be best. Discuss this with the staff to find the most suitable option. [x]   Lab & Imaging Results: We'll contact you directly if results are abnormal or you don't use MyChart. Most normal results will be on MyChart within 2-3 business days, with a review message from Dr. Jesus. Haven't heard back in 2 weeks? Need results sooner? Contact us  at [phone]. [x]   Referrals: Our referral coordinator will manage specialist referrals. The specialist's office should contact you within 2 weeks to schedule an appointment. Call us  if you haven't heard from them after 2 weeks.  Staying Connected  [x]   MyChart: Activate your MyChart for the fastest way to access results and message us . See the last page of this paperwork for instructions on how to  activate.  Billing  [x]   X-ray & Lab Orders: These are billed by separate companies. Contact the invoicing company directly for questions or concerns. [x]   Visit Charges: Discuss any billing inquiries with our administrative services team.  Your Satisfaction Matters  [x]   Share Your Experience: We strive for your satisfaction! If you have any complaints, or preferably compliments, please let Dr. Jesus know directly or contact our Practice Administrators, Manuelita Rubin or [employer], by asking at the front desk.    Medical Screening Exam A medical screening exam (MSE) helps to determine whether you need immediate medical treatment relating to any number of symptoms you are having. This type of exam may be done in an emergency department, an urgent care setting, or your health care provider's office. Depending on your symptoms and severity, you may need additional tests or medical  therapy. It is important to note that an MSE does not necessarily mean that you will need or receive further medical testing or interventions if your symptoms are not deemed to be medically urgent (emergent). Tell a health care provider about: Any allergies you have. All medicines you are taking, including vitamins, herbs, eye drops, creams, and over-the-counter medicines. Any problems you or family members have had with anesthetic medicines. Any bleeding problems you have. Any surgeries you have had. Any medical conditions you have. Whether you are pregnant or may be pregnant. What happens during the test? During the exam, a health care provider does a short, often focused, physical exam and asks about your medical history to assess: Your current symptoms. Your overall health. Your need for possible further medical intervention. What can I expect after the test? If you have a regular health care provider, make an appointment for a follow-up visit with him or her. If you do not have a regular health care provider, ask about resources in your community. Your medical screening exam may determine that: You do not need emergency treatment at this time. You need treatment right away. You need to be transferred to another medical center. This may happen if you need an emergent specialist or consultant that is not available at the medical center you are at. You need to have more tests. A medical specialist may be consulted if needed. Get help right away if: Your condition gets worse. You develop new or troubling symptoms before you see your health care provider. These symptoms may represent a serious problem that is an emergency. Do not wait to see if the symptoms will go away. Get medical help right away. Call your local emergency services (911 in the [place] Do not drive yourself to the hospital. Summary A medical screening exam helps to determine whether you need medical treatment right away.  This type of exam may be done in an emergency department, an urgent care setting, or your health care provider's office. During the exam, a health care provider does a short physical exam and asks about your current symptoms and overall health. Depending on the exam, more tests or therapies may be ordered. However, an MSE does not necessarily mean that you will have further medical testing if your symptoms are not deemed to be urgent. If you need further care that is not offered at your current medical center, you may need to be transferred to another facility. This information is not intended to replace advice given to you by your health care provider. Make sure you discuss any questions you have with your health care provider. Document Revised: 08/01/2021  Document Reviewed: 03/29/2021 Elsevier Patient Education  2024 [employer]         ?? Trans Fats: What You Need to Know (and How to Avoid Them) Protect Your Heart, Brain, and Overall Health  ? What Are Trans Fats? Trans fats are a type of unhealthy fat that can increase your risk of: Heart disease Stroke Type 2 diabetes Inflammation Memory problems They are artificially made through a process called hydrogenation and were once common in processed foods for better shelf life and texture.  ?? Why Should I Avoid Trans Fats? Even small amounts of trans fats can: Raise "bad" LDL cholesterol Lower "good" HDL cholesterol Cause inflammation in your blood vessels Increase your risk of heart attack or stroke There is no safe level of artificial trans fat.  ?? How to Spot Trans Fats (Even When the Label Says "0g") Food companies can legally say "0 grams trans fat" if the product contains less than 0.5 grams per serving -- but that can add up fast! Look at the ingredients list for these clues: ?? Partially hydrogenated oil ? this means trans fat is present. ? Avoid foods with "shortening" or "hydrogenated" oils.  ?? Common Foods  That May Contain Trans Fats Even today, you may find trans fats in: Baked goods (cookies, cakes, pies) Microwave popcorn Crackers Margarine and shortening Fried fast foods Frozen pizza  ? Healthier Choices Choose products with 0g trans fat and no "partially hydrogenated oil" in the ingredients. Use olive oil, avocado oil, or canola oil for cooking. Eat more whole, unprocessed foods: fruits, vegetables, whole grains, and lean proteins. Choose baked over fried, and fresh over packaged.  ?? Takeaway Message Trans fats are harmful, even in small amounts. To protect your health: Read labels carefully. Look beyond "0g trans fat" and scan for "partially hydrogenated oils." Choose whole foods and heart-healthy fats.

## 2024-10-12 NOTE — Progress Notes (Signed)
 [facility] at [facility] [address] [place], [place] [address] Office:  [phone]  -- Annual Preventive Medical Office Visit --  Patient:  Christopher Hoffman      Age: 43 y.o.       Sex:  male  Date:   10/12/2024 Patient Care Team: Jesus Bernardino MATSU, MD as PCP - General (Internal Medicine) Today's Healthcare Provider: Bernardino MATSU Jesus, MD  ========================================= Chief complaint: Annual Exam (Pt is present for cpe pt is fasting only had coffee.)  Purpose of Visit: Comprehensive preventive health assessment and personalized health maintenance planning.  This encounter was conducted as a Comprehensive Physical Exam (CPE) preventive care annual visit. The patient's medical history and problem list were reviewed to inform individualized preventive care recommendations.   No problem-specific medical treatment was provided during this visit.  Assessment & Plan Encounter for annual general medical examination with abnormal findings in adult Adult Wellness Visit   A 43 year old male experiences high stress, poor sleep, and a suboptimal diet. There is no family history of early heart problems, but there is a history of cognitive decline and Parkinson's disease. Cardiovascular health, cancer screening, and dietary recommendations were discussed. He should follow a Mediterranean diet with extra virgin olive oil, fish, and limited red meat, and consider a modified keto Mediterranean diet. Avoid trans fats and maintain a balanced diet. Exercise recommendations include moderate intensity interval training (MIT) and zone 2 exercise. Cologuard was discussed for colon cancer screening due to its high accuracy, with cost considerations noted. He is encouraged to research self-pay lab promotions for extensive metabolic panel and glucose testing.    ICD-10-CM   1. Encounter for annual general medical examination with abnormal findings in adult  Z00.01        Reviewed/updated/encouraged completion: Immunization History  Administered Date(s) Administered   Influenza,inj,Quad PF,6+ Mos 08/11/2018   PFIZER(Purple Top)SARS-COV-2 Vaccination 03/14/2020, 04/05/2020   PPD Test 03/24/2015, 05/29/2015   Tdap 02/02/2021, 01/07/2023   Health Maintenance Due  Topic Date Due   Hepatitis C Screening  Never done   Hepatitis B Vaccines 19-59 Average Risk (1 of 3 - 19+ 3-dose series) Never done   HPV VACCINES (1 - 3-dose SCDM series) Never done   Health Maintenance  Topic Date Due   Hepatitis C Screening  Never done   Hepatitis B Vaccines 19-59 Average Risk (1 of 3 - 19+ 3-dose series) Never done   HPV VACCINES (1 - 3-dose SCDM series) Never done   COVID-19 Vaccine (3 - 2025-26 season) 10/28/2024 (Originally 08/02/2024)   Influenza Vaccine  03/01/2025 (Originally 07/02/2024)   DTaP/Tdap/Td (3 - Td or Tdap) 01/07/2033   HIV Screening  Completed   Pneumococcal Vaccine  Aged Out   Meningococcal B Vaccine  Aged Out    Reviewed the following verbally with patient and provided AVS materials:   HEALTH MAINTENANCE COUNSELING AND ANTICIPATORY GUIDANCE    Preventive Measure Recommendation  Eye Exams Every 1-2 years  Dental Care Cleanings every 6 months or more, brush/floss 3x daily  Sinus Care Saline spray rinses daily  Sleep 8 hours nightly, good sleep hygiene, e-monitoring if any daytime drowsiness  Diet Fruits/vegetables/fiber/healthy fats, balance and moderation  Exercise 150 minutes weekly  Risk Behaviors Discouraged any/all high risk behaviors    CANCER SCREENING SHARED DECISION MAKING    Penile/Testicle/Scrotum Encouraged self-monitoring and reporting of genital abnormalities. Patient reports none.  Thyroid  Thyroid  was palpated for nodules today.  Prostate Individualized risks/benefits/costs discussed  No results found  for: PSA  Colon HM Colonoscopy   This patient has no relevant Health Maintenance data.   Patient is <45 without  additional risk factors (reports no bleeding or early family history or inflammatory bowel disease); no routine screening indicated today. Counseling provided on symptoms that warrant earlier evaluation. (Education given.)  Lung Current guidelines recommend individuals aged 27 to 14 who currently smoke or formerly smoked and have a >= 20 pack-year smoking history should undergo annual screening with low-dose computed tomography (LDCT). Tobacco Use: Low Risk  (10/12/2024)   Patient History    Smoking Tobacco Use: Never    Smokeless Tobacco Use: Never    Passive Exposure: Not on file   Social History   Tobacco Use  Smoking Status Never  Smokeless Tobacco Never    Skin Advised regular sunscreen use. Patient denies worrisome, changing, or new skin lesions. Offered to include images in chart for surveillance. Showed patient these pictures of melanomas for reference to educate for self-monitoring.  Other Cancers Discussed lack of screening guidelines and insurance coverage for other cancer types.    Discussed the use of AI scribe software for clinical note transcription with the patient, who gave verbal consent to proceed.  History of Present Illness 43 year old male who presents for an annual physical exam.  He feels generally unhealthy and is interested in a comprehensive health workup. 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 He acknowledges poor dietary habits, insufficient sleep, and higher alcohol consumption than he believes is healthy. He wants to make lifestyle changes to improve his health.  He is interested in understanding his metabolic health better, mentioning that he feels unwell after consuming heavy carbohydrate meals. He is considering dietary changes and is curious about the potential benefits of a ketogenic or Mediterranean diet. He is also contemplating the use of a continuous glucose monitor to better understand his insulin  response.  He has a family history of cognitive decline, including Parkinson's and Alzheimer's disease. His father experienced cognitive decline due to Parkinson's and his grandfather had Alzheimer's. He is aware of the potential link between sleep apnea and cognitive decline, as well as the concept of Alzheimer's being related to 'type three diabetes'.  He is considering various health screenings, including cardiovascular and cancer screenings, due to his high-stress lifestyle and family history. He is particularly interested in non-invasive options like Cologuard for colon cancer screening.  Photographs Taken 10/12/2024 :    Right flank flat melanotic lesion under uv light and normal.  ROS A comprehensive ROS was negative for any concerning symptoms.   Completed medication reconciliation: Current Outpatient Medications on File Prior to Visit  Medication Sig   ammonium lactate (LAC-HYDRIN) 12 % lotion APPLY TO AFFECTED AREA ONCE TO TWICE DAILY (Patient not taking: Reported on 10/12/2024)   doxycycline  (VIBRA -TABS) 100 MG tablet Take 1 tablet (100 mg total) by mouth 2 (two) times daily. (Patient not taking: Reported on 10/12/2024)   No current facility-administered medications on file prior to visit.  There are no discontinued medications.The following were reviewed and/or entered/updated into our electronic MEDICAL RECORD NUMBERPast Medical History:  Diagnosis Date   DDD (degenerative disc disease), cervical 07/28/2023   Past Surgical History:  Procedure Laterality Date   LAPAROSCOPIC APPENDECTOMY N/A 07/19/2017   Procedure: APPENDECTOMY LAPAROSCOPIC;  Surgeon: Signe Mitzie LABOR, MD;  Location: [facility] OR;  Service: General;  Laterality: N/A;   VASECTOMY     Social History   Socioeconomic History  Marital status: Married    Spouse name: Not on file   Number of children: Not on file   Years of education: Not on file   Highest education level: Not on file  Occupational History   Not on  file  Tobacco Use   Smoking status: Never   Smokeless tobacco: Never  Vaping Use   Vaping status: Never Used  Substance and Sexual Activity   Alcohol use: Yes    Comment: occasional   Drug use: No   Sexual activity: Yes    Partners: Female  Other Topics Concern   Not on file  Social History Narrative   Not on file   Social Drivers of Health   Financial Resource Strain: Not on file  Food Insecurity: Not on file  Transportation Needs: Not on file  Physical Activity: Not on file  Stress: Not on file  Social Connections: Unknown (04/16/2022)   [facility]   Social Network    Social Network: Not on file  Intimate Partner Violence: Unknown (03/08/2022)   Received from [facility]   HITS    Physically Hurt: Not on file    Insult or Talk Down To: Not on file    Threaten Physical Harm: Not on file    Scream or Curse: Not on file   Family History  Problem Relation Age of Onset   Stroke Father    Hyperlipidemia Father    Cancer Maternal Grandmother    Hypertension Maternal Grandfather   No Known Allergies Social History   Substance and Sexual Activity  Sexual Activity Yes   Partners: Female  @    10/12/2024    8:42 AM  Depression screen PHQ 2/9  Decreased Interest 0  Down, Depressed, Hopeless 0  PHQ - 2 Score 0      10/09/2023    8:38 AM  Fall Risk   Falls in the past year? 0  Number falls in past yr: 0  Injury with Fall? 0  Risk for fall due to : No Fall Risks  Follow up Falls evaluation completed     BP 120/82   Pulse 83   Temp 98 F (36.7 C) (Temporal)   Ht 5' 8 (1.727 m)   Wt 203 lb 9.6 oz (92.4 kg)   SpO2 98%   BMI 30.96 kg/m  BP Readings from Last 3 Encounters:  10/12/24 120/82  06/25/24 120/82  10/09/23 130/78   Wt Readings from Last 10 Encounters:  10/12/24 203 lb 9.6 oz (92.4 kg)  06/25/24 197 lb 12.8 oz (89.7 kg)  10/09/23 198 lb 9.6 oz (90.1 kg)  07/28/23 204 lb 9.6 oz (92.8 kg)  06/13/22 199 lb 3.2 oz (90.4 kg)   02/02/21 198 lb (89.8 kg)  08/11/18 188 lb (85.3 kg)  07/19/17 203 lb (92.1 kg)  03/28/17 204 lb 3.2 oz (92.6 kg)  Physical Exam  Physical Exam SKIN: Skin lesion present.  GEN: No acute distress, resting comfortably. HEENT: Tympanic membranes normal appearing bilaterally, oropharynx clear, no thyromegaly noted, no palpable lymphadenopathy or thyroid  nodules. CARDIOVASCULAR: S1 and S2 heart sounds with regular rate and rhythm, no murmurs appreciated. PULMONARY: Normal work of breathing, clear to auscultation bilaterally, no crackles, wheezes, or rhonchi. ABDOMEN: Soft, nontender, nondistended. MSK: No edema, cyanosis, or clubbing noted. SKIN: Warm, dry, no lesions of concern observed. NEUROLOGICAL: Cranial nerves II-XII grossly intact, strength 5/5 in upper and lower extremities, reflexes symmetric and intact bilaterally. PSYCH: Normal affect and thought content, pleasant and  cooperative.  Last CBC Lab Results  Component Value Date   WBC 5.5 10/10/2023   HGB 15.4 10/10/2023   HCT 44.4 10/10/2023   MCV 87.1 10/10/2023   MCH 30.0 07/19/2017   RDW 13.2 10/10/2023   PLT 243.0 10/10/2023   Last metabolic panel Lab Results  Component Value Date   GLUCOSE 108 (H) 10/10/2023   NA 137 10/10/2023   K 4.0 10/10/2023   CL 103 10/10/2023   CO2 28 10/10/2023   BUN 14 10/10/2023   CREATININE 0.96 10/10/2023   GFR 98.36 10/10/2023   CALCIUM 9.2 10/10/2023   PROT 7.1 10/10/2023   ALBUMIN 4.5 10/10/2023   BILITOT 0.6 10/10/2023   ALKPHOS 63 10/10/2023   AST 20 10/10/2023   ALT 19 10/10/2023   ANIONGAP 10 07/19/2017   Last lipids Lab Results  Component Value Date   CHOL 179 10/10/2023   HDL 52.50 10/10/2023   LDLCALC 109 (H) 10/10/2023   TRIG 89.0 10/10/2023   CHOLHDL 3 10/10/2023   Last hemoglobin A1c No results found for: HGBA1C Last thyroid  functions Lab Results  Component Value Date   TSH 2.63 10/10/2023         ======================================  IMPORTANT HEALTH REMINDERS: Report any new or changing skin lesions promptly Maintain recommended screening schedules Discuss any new family history of cancer at future visits Follow up on any new symptoms that persist more than two weeks      Notes:  This document was synthesized by artificial intelligence ([employer]bridge) using HIPAA-compliant recording of the clinical interaction;   We discussed the use of AI scribe software for clinical note transcription with the patient, who gave verbal consent to proceed.    This encounter employed state-of-the-art, real-time, collaborative documentation. The patient was empowered to actively review and assist in updating their electronic medical record on a shared monitor, ensuring transparency and improving accuracy.    Prior to and at the beginning of Comprehensive Physical Exam (CPE) preventive care annual visit appointment types  we clarify to patients Our goal today is to focus on your preventive or annual Comprehensive Physical Exam (CPE) preventive care annual visit, which typically covers routine screenings and overall health maintenance. However, if you share any new or concerning symptoms--such as dizziness, passing out, severe pain, or anything else that may point to a more serious issue--we are both legally and ethically required to evaluate it. We cannot simply overlook or ignore such concerns, even if you later decide you don't want to discuss them, because it could jeopardize your health.  If addressing a new concern takes us  beyond the scope of the preventive visit, we may need to bill separately for that portion of care. We understand financial considerations are important, and we're happy to discuss your options if something new comes up. However, we want to be clear that once you mention a potentially serious issue, we must investigate it; we can't ethically or legally exclude that from our records  or our evaluation. Please let us  know all of your questions or worries. Together, we can decide how best to manage them and how to minimize any unexpected costs, but we want to keep you safe above all else.   This disclosure is mandated by professional ethics and legal obligations, as healthcare providers must address any substantial health concerns raised during any patient interaction and a comprehensive ROS is required by insurance companies for billing preventive-care visit type.   This disclosure ultimately discourages patients financially from reporting significant health  issues.   Medical Screening Exam A medical screening exam (MSE) helps to determine whether you need immediate medical treatment relating to any number of symptoms you are having. This type of exam may be done in an emergency department, an urgent care setting, or your health care provider's office. Depending on your symptoms and severity, you may need additional tests or medical therapy. It is important to note that an MSE does not necessarily mean that you will need or receive further medical testing or interventions if your symptoms are not deemed to be medically urgent (emergent). Tell a health care provider about: Any allergies you have. All medicines you are taking, including vitamins, herbs, eye drops, creams, and over-the-counter medicines. Any problems you or family members have had with anesthetic medicines. Any bleeding problems you have. Any surgeries you have had. Any medical conditions you have. Whether you are pregnant or may be pregnant. What happens during the test? During the exam, a health care provider does a short, often focused, physical exam and asks about your medical history to assess: Your current symptoms. Your overall health. Your need for possible further medical intervention. What can I expect after the test? If you have a regular health care provider, make an appointment for a follow-up  visit with him or her. If you do not have a regular health care provider, ask about resources in your community. Your medical screening exam may determine that: You do not need emergency treatment at this time. You need treatment right away. You need to be transferred to another medical center. This may happen if you need an emergent specialist or consultant that is not available at the medical center you are at. You need to have more tests. A medical specialist may be consulted if needed. Get help right away if: Your condition gets worse. You develop new or troubling symptoms before you see your health care provider. These symptoms may represent a serious problem that is an emergency. Do not wait to see if the symptoms will go away. Get medical help right away. Call your local emergency services (911 in the [place] Do not drive yourself to the hospital. Summary A medical screening exam helps to determine whether you need medical treatment right away. This type of exam may be done in an emergency department, an urgent care setting, or your health care provider's office. During the exam, a health care provider does a short physical exam and asks about your current symptoms and overall health. Depending on the exam, more tests or therapies may be ordered. However, an MSE does not necessarily mean that you will have further medical testing if your symptoms are not deemed to be urgent. If you need further care that is not offered at your current medical center, you may need to be transferred to another facility. This information is not intended to replace advice given to you by your health care provider. Make sure you discuss any questions you have with your health care provider. Document Revised: 08/01/2021 Document Reviewed: 03/29/2021 Elsevier Patient Education  2024 [employer]   Health Maintenance, Male Adopting a healthy lifestyle and getting preventive care are important in promoting  health and wellness. Ask your health care provider about: The right schedule for you to have regular tests and exams. Things you can do on your own to prevent diseases and keep yourself healthy. What should I know about diet, weight, and exercise? Eat a healthy diet  Eat a diet that includes plenty of vegetables,  fruits, low-fat dairy products, and lean protein. Do not eat a lot of foods that are high in solid fats, added sugars, or sodium. Maintain a healthy weight Body mass index (BMI) is a measurement that can be used to identify possible weight problems. It estimates body fat based on height and weight. Your health care provider can help determine your BMI and help you achieve or maintain a healthy weight. Get regular exercise Get regular exercise. This is one of the most important things you can do for your health. Most adults should: Exercise for at least 150 minutes each week. The exercise should increase your heart rate and make you sweat (moderate-intensity exercise). Do strengthening exercises at least twice a week. This is in addition to the moderate-intensity exercise. Spend less time sitting. Even light physical activity can be beneficial. Watch cholesterol and blood lipids Have your blood tested for lipids and cholesterol at 42 years of age, then have this test every 5 years. You may need to have your cholesterol levels checked more often if: Your lipid or cholesterol levels are high. You are older than 42 years of age. You are at high risk for heart disease. What should I know about cancer screening? Many types of cancers can be detected early and may often be prevented. Depending on your health history and family history, you may need to have cancer screening at various ages. This may include screening for: Colorectal cancer. Prostate cancer. Skin cancer. Lung cancer. What should I know about heart disease, diabetes, and high blood pressure? Blood pressure and heart  disease High blood pressure causes heart disease and increases the risk of stroke. This is more likely to develop in people who have high blood pressure readings or are overweight. Talk with your health care provider about your target blood pressure readings. Have your blood pressure checked: Every 3-5 years if you are 44-6 years of age. Every year if you are 88 years old or older. If you are between the ages of 28 and 61 and are a current or former smoker, ask your health care provider if you should have a one-time screening for abdominal aortic aneurysm (AAA). Diabetes Have regular diabetes screenings. This checks your fasting blood sugar level. Have the screening done: Once every three years after age 9 if you are at a normal weight and have a low risk for diabetes. More often and at a younger age if you are overweight or have a high risk for diabetes. What should I know about preventing infection? Hepatitis B If you have a higher risk for hepatitis B, you should be screened for this virus. Talk with your health care provider to find out if you are at risk for hepatitis B infection. Hepatitis C Blood testing is recommended for: Everyone born from [birth year] through 1964. Anyone with known risk factors for hepatitis C. Sexually transmitted infections (STIs) You should be screened each year for STIs, including gonorrhea and chlamydia, if: You are sexually active and are younger than 42 years of age. You are older than 42 years of age and your health care provider tells you that you are at risk for this type of infection. Your sexual activity has changed since you were last screened, and you are at increased risk for chlamydia or gonorrhea. Ask your health care provider if you are at risk. Ask your health care provider about whether you are at high risk for HIV. Your health care provider may recommend a prescription medicine to help prevent  HIV infection. If you choose to take medicine to prevent  HIV, you should first get tested for HIV. You should then be tested every 3 months for as long as you are taking the medicine. Follow these instructions at home: Alcohol use Do not drink alcohol if your health care provider tells you not to drink. If you drink alcohol: Limit how much you have to 0-2 drinks a day. Know how much alcohol is in your drink. In the [place] one drink equals one 12 oz bottle of beer (355 mL), one 5 oz glass of wine (148 mL), or one 1 oz glass of hard liquor (44 mL). Lifestyle Do not use any products that contain nicotine or tobacco. These products include cigarettes, chewing tobacco, and vaping devices, such as e-cigarettes. If you need help quitting, ask your health care provider. Do not use street drugs. Do not share needles. Ask your health care provider for help if you need support or information about quitting drugs. General instructions Schedule regular health, dental, and eye exams. Stay current with your vaccines. Tell your health care provider if: You often feel depressed. You have ever been abused or do not feel safe at home. Summary Adopting a healthy lifestyle and getting preventive care are important in promoting health and wellness. Follow your health care provider's instructions about healthy diet, exercising, and getting tested or screened for diseases. Follow your health care provider's instructions on monitoring your cholesterol and blood pressure. This information is not intended to replace advice given to you by your health care provider. Make sure you discuss any questions you have with your health care provider. Document Revised: 04/09/2021 Document Reviewed: 04/09/2021 Elsevier Patient Education  2024 [employer].
